# Patient Record
Sex: Female | Born: 1955 | Race: White | Hispanic: No | Smoking: Never smoker
Health system: Southern US, Community
[De-identification: ages and names within clinical notes are randomized; demographics above are authoritative.]

## PROBLEM LIST (undated history)

## (undated) DIAGNOSIS — R3915 Urgency of urination: Secondary | ICD-10-CM

## (undated) DIAGNOSIS — E785 Hyperlipidemia, unspecified: Secondary | ICD-10-CM

## (undated) DIAGNOSIS — U071 COVID-19: Secondary | ICD-10-CM

## (undated) DIAGNOSIS — D649 Anemia, unspecified: Secondary | ICD-10-CM

## (undated) DIAGNOSIS — E119 Type 2 diabetes mellitus without complications: Secondary | ICD-10-CM

## (undated) DIAGNOSIS — R519 Headache, unspecified: Secondary | ICD-10-CM

## (undated) DIAGNOSIS — G4730 Sleep apnea, unspecified: Secondary | ICD-10-CM

## (undated) DIAGNOSIS — M1990 Unspecified osteoarthritis, unspecified site: Secondary | ICD-10-CM

## (undated) DIAGNOSIS — E7800 Pure hypercholesterolemia, unspecified: Secondary | ICD-10-CM

## (undated) DIAGNOSIS — S52501A Unspecified fracture of the lower end of right radius, initial encounter for closed fracture: Secondary | ICD-10-CM

## (undated) DIAGNOSIS — R6510 Systemic inflammatory response syndrome (SIRS) of non-infectious origin without acute organ dysfunction: Secondary | ICD-10-CM

## (undated) DIAGNOSIS — F32A Depression, unspecified: Secondary | ICD-10-CM

## (undated) DIAGNOSIS — R51 Headache: Secondary | ICD-10-CM

## (undated) DIAGNOSIS — Z973 Presence of spectacles and contact lenses: Secondary | ICD-10-CM

## (undated) DIAGNOSIS — Z87442 Personal history of urinary calculi: Secondary | ICD-10-CM

## (undated) DIAGNOSIS — I10 Essential (primary) hypertension: Secondary | ICD-10-CM

## (undated) DIAGNOSIS — F329 Major depressive disorder, single episode, unspecified: Secondary | ICD-10-CM

## (undated) DIAGNOSIS — I499 Cardiac arrhythmia, unspecified: Secondary | ICD-10-CM

## (undated) HISTORY — DX: Type 2 diabetes mellitus without complications: E11.9

## (undated) HISTORY — PX: ABDOMINAL HYSTERECTOMY: SHX81

## (undated) HISTORY — PX: APPENDECTOMY: SHX54

## (undated) HISTORY — PX: TUBAL LIGATION: SHX77

## (undated) HISTORY — PX: OTHER SURGICAL HISTORY: SHX169

## (undated) HISTORY — PX: COLONOSCOPY: SHX174

## (undated) HISTORY — PX: TONSILLECTOMY: SUR1361

## (undated) HISTORY — PX: KNEE ARTHROPLASTY: SHX992

## (undated) HISTORY — PX: ADENOIDECTOMY: SHX5191

## (undated) HISTORY — PX: DILATION AND CURETTAGE OF UTERUS: SHX78

---

## 2016-08-15 ENCOUNTER — Emergency Department (HOSPITAL_COMMUNITY): Payer: BLUE CROSS/BLUE SHIELD

## 2016-08-15 ENCOUNTER — Encounter (HOSPITAL_COMMUNITY): Payer: Self-pay | Admitting: *Deleted

## 2016-08-15 ENCOUNTER — Emergency Department (HOSPITAL_COMMUNITY)
Admission: EM | Admit: 2016-08-15 | Discharge: 2016-08-15 | Disposition: A | Discharge status: Home / Self Care | Payer: BLUE CROSS/BLUE SHIELD | Attending: Emergency Medicine | Admitting: Emergency Medicine

## 2016-08-15 DIAGNOSIS — I10 Essential (primary) hypertension: Secondary | ICD-10-CM | POA: Diagnosis not present

## 2016-08-15 DIAGNOSIS — Y929 Unspecified place or not applicable: Secondary | ICD-10-CM | POA: Insufficient documentation

## 2016-08-15 DIAGNOSIS — Y939 Activity, unspecified: Secondary | ICD-10-CM | POA: Diagnosis not present

## 2016-08-15 DIAGNOSIS — S6991XA Unspecified injury of right wrist, hand and finger(s), initial encounter: Secondary | ICD-10-CM | POA: Diagnosis present

## 2016-08-15 DIAGNOSIS — Z79899 Other long term (current) drug therapy: Secondary | ICD-10-CM | POA: Insufficient documentation

## 2016-08-15 DIAGNOSIS — S52591A Other fractures of lower end of right radius, initial encounter for closed fracture: Secondary | ICD-10-CM | POA: Insufficient documentation

## 2016-08-15 DIAGNOSIS — Y9301 Activity, walking, marching and hiking: Secondary | ICD-10-CM | POA: Diagnosis not present

## 2016-08-15 DIAGNOSIS — Y999 Unspecified external cause status: Secondary | ICD-10-CM | POA: Diagnosis not present

## 2016-08-15 DIAGNOSIS — R52 Pain, unspecified: Secondary | ICD-10-CM

## 2016-08-15 DIAGNOSIS — W1830XA Fall on same level, unspecified, initial encounter: Secondary | ICD-10-CM | POA: Insufficient documentation

## 2016-08-15 DIAGNOSIS — S62101A Fracture of unspecified carpal bone, right wrist, initial encounter for closed fracture: Secondary | ICD-10-CM

## 2016-08-15 HISTORY — DX: Essential (primary) hypertension: I10

## 2016-08-15 MED ORDER — ONDANSETRON HCL 4 MG/2ML IJ SOLN
4.0000 mg | Freq: Once | INTRAMUSCULAR | Status: AC
  Administered 2016-08-15: 4 mg via INTRAVENOUS
  Filled 2016-08-15: qty 2

## 2016-08-15 MED ORDER — HYDROMORPHONE HCL 2 MG/ML IJ SOLN
1.0000 mg | Freq: Once | INTRAMUSCULAR | Status: AC
  Administered 2016-08-15: 1 mg via INTRAVENOUS
  Filled 2016-08-15: qty 1

## 2016-08-15 MED ORDER — KETAMINE HCL 10 MG/ML IJ SOLN: 1.0000 mg/kg | Freq: Once | INTRAMUSCULAR | Status: DC

## 2016-08-15 MED ORDER — FENTANYL CITRATE (PF) 100 MCG/2ML IJ SOLN
INTRAMUSCULAR | Status: AC
  Administered 2016-08-15: 50 ug via NASAL
  Filled 2016-08-15: qty 2

## 2016-08-15 MED ORDER — FENTANYL CITRATE (PF) 100 MCG/2ML IJ SOLN
50.0000 ug | INTRAMUSCULAR | Status: AC | PRN
  Administered 2016-08-15 (×2): 50 ug via NASAL

## 2016-08-15 MED ORDER — SODIUM CHLORIDE 0.9 % IV BOLUS (SEPSIS)
1000.0000 mL | Freq: Once | INTRAVENOUS | Status: AC
  Administered 2016-08-15: 1000 mL via INTRAVENOUS

## 2016-08-15 MED ORDER — KETAMINE HCL-SODIUM CHLORIDE 100-0.9 MG/10ML-% IV SOSY
1.0000 mg/kg | PREFILLED_SYRINGE | Freq: Once | INTRAVENOUS | Status: AC
Start: 2016-08-15 — End: 2016-08-15
  Administered 2016-08-15: 80 mg via INTRAVENOUS
  Filled 2016-08-15: qty 20

## 2016-08-15 MED ORDER — LIDOCAINE HCL (PF) 1 % IJ SOLN
30.0000 mL | Freq: Once | INTRAMUSCULAR | Status: AC
  Administered 2016-08-15: 30 mL
  Filled 2016-08-15: qty 30

## 2016-08-15 MED ORDER — FENTANYL CITRATE (PF) 100 MCG/2ML IJ SOLN
INTRAMUSCULAR | Status: AC
  Filled 2016-08-15: qty 2

## 2016-08-15 NOTE — ED Provider Notes (Signed)
[facility]-EMERGENCY DEPT Provider Note   CSN: [id number] Arrival date & time: 08/15/16  1730     History   Chief Complaint Chief Complaint  Patient presents with  . Arm Pain  . Fall    HPI Patricia Davidson is a 61 y.o. female.  HPI Patient arrives after she had an accidental fall onto her right wrist while walking her dog today She states her only injuries at her right wrist She did not injure her head She states the pain is severe EMS arrived and noted an obvious deformity to her right wrist Patient does take chronic narcotics but states pain is severe She states she has some diffuse numbness along all 5 digits but no weakness No wound  Past Medical History:  Diagnosis Date  . Hypertension     There are no active problems to display for this patient.   Past Surgical History:  Procedure Laterality Date  . ABDOMINAL HYSTERECTOMY    . KNEE ARTHROPLASTY      OB History    No data available       Home Medications    Prior to Admission medications   Medication Sig Start Date End Date Taking? Authorizing Provider  amoxicillin (AMOXIL) 500 MG capsule Take 2,000 mg by mouth See admin instructions. One hour prior to dental appointments   Yes Historical Provider, MD  amphetamine-dextroamphetamine (ADDERALL XR) 30 MG 24 hr capsule Take 30 mg by mouth every morning.   Yes Historical Provider, MD  amphetamine-dextroamphetamine (ADDERALL) 10 MG tablet Take 10 mg by mouth 2 (two) times daily as needed (as directed).    Yes Historical Provider, MD  aspirin EC 81 MG tablet Take 81 mg by mouth daily.   Yes Historical Provider, MD  atenolol (TENORMIN) 50 MG tablet Take 50 mg by mouth daily.   Yes Historical Provider, MD  atorvastatin (LIPITOR) 40 MG tablet Take 40 mg by mouth at bedtime.   Yes Historical Provider, MD  calcium-vitamin D (OSCAL WITH D) 500-200 MG-UNIT tablet Take 1 tablet by mouth at bedtime.   Yes Historical Provider, MD  DULoxetine (CYMBALTA) 30 MG capsule Take  90 mg by mouth at bedtime.   Yes Historical Provider, MD  hydrochlorothiazide (MICROZIDE) 12.5 MG capsule Take 12.5 mg by mouth daily.   Yes Historical Provider, MD  HYDROcodone-acetaminophen (NORCO) 10-325 MG tablet Take 1 tablet by mouth every 6 (six) hours as needed (for breakthrough pain).   Yes Historical Provider, MD  morphine (MS CONTIN) 100 MG 12 hr tablet Take 100 mg by mouth at bedtime.   Yes Historical Provider, MD    Family History No family history on file.  Social History Social History  Substance Use Topics  . Smoking status: Never Smoker  . Smokeless tobacco: Never Used  . Alcohol use No     Allergies   Sulfa antibiotics   Review of Systems Review of Systems  Cardiovascular: Negative for chest pain.  Neurological: Negative for syncope.  All other systems reviewed and are negative.    Physical Exam Updated Vital Signs BP 156/80   Pulse 75   Temp 98.2 F (36.8 C) (Oral)   Resp 15   Wt 115.7 kg   SpO2 100%   Physical Exam  Constitutional: She appears well-developed and well-nourished. No distress.  HENT:  Head: Normocephalic and atraumatic.  Eyes: Conjunctivae are normal.  Neck: Neck supple.  Cardiovascular: Normal rate and regular rhythm.   No murmur heard. Pulmonary/Chest: Effort normal and breath sounds normal. No  respiratory distress.  Abdominal: Soft. There is no tenderness.  Musculoskeletal:  No c/t/l spine tenderness No tenderness along MSK system except in right wrist where she has an obvious deformity with swelling, deformity of wrist Distally has numbness of all five digits but still can feel 2+ ulnar and radial nerve Is able to abduct thumb, cross 2-3 digits, and touch 5th and first digits  Neurological: She is alert.  Skin: Skin is warm and dry. Capillary refill takes less than 2 seconds.  Psychiatric: She has a normal mood and affect.  Nursing note and vitals reviewed.    ED Treatments / Results  Labs (all labs ordered are  listed, but only abnormal results are displayed) Labs Reviewed - No data to display  EKG  EKG Interpretation None       Radiology Dg Elbow 2 Views Right  Result Date: 08/15/2016 CLINICAL DATA:  Status post fall with wrist and elbow pain. EXAM: RIGHT FOREARM - 2 VIEW; RIGHT ELBOW - 2 VIEW COMPARISON:  None. FINDINGS: Right forearm: Impacted distal radius fracture with volar apex angulation and mild comminution. Probable intra-articular extension of the fracture. Mildly displaced ulnar styloid fracture. No radiocarpal dislocation. Mild osteoarthrosis of the basal joint and triscaphe complex. Right elbow: There is no evidence of fracture or other focal bone lesions. Soft tissues are unremarkable. No elbow joint effusion or dislocation. IMPRESSION: 1. Acute impacted distal radius fracture with volar apex angulation and mild comminution. Probable intra-articular extension. 2. Mildly displaced acute ulnar styloid fracture. 3. No wrist dislocation. 4. No acute fracture or dislocation of the right elbow. Electronically Signed   By: Mitzi Hansen M.D.   On: 08/15/2016 20:07   Dg Forearm Right  Result Date: 08/15/2016 CLINICAL DATA:  Status post fall with wrist and elbow pain. EXAM: RIGHT FOREARM - 2 VIEW; RIGHT ELBOW - 2 VIEW COMPARISON:  None. FINDINGS: Right forearm: Impacted distal radius fracture with volar apex angulation and mild comminution. Probable intra-articular extension of the fracture. Mildly displaced ulnar styloid fracture. No radiocarpal dislocation. Mild osteoarthrosis of the basal joint and triscaphe complex. Right elbow: There is no evidence of fracture or other focal bone lesions. Soft tissues are unremarkable. No elbow joint effusion or dislocation. IMPRESSION: 1. Acute impacted distal radius fracture with volar apex angulation and mild comminution. Probable intra-articular extension. 2. Mildly displaced acute ulnar styloid fracture. 3. No wrist dislocation. 4. No acute  fracture or dislocation of the right elbow. Electronically Signed   By: Mitzi Hansen M.D.   On: 08/15/2016 20:07   Dg Wrist 2 Views Right  Result Date: 08/15/2016 CLINICAL DATA:  Status post reduction of right wrist. EXAM: RIGHT WRIST - 2 VIEW COMPARISON:  2/21/ 18 FINDINGS: Interval placement of overlying casting material. Close reduction of the comminuted distal radius fracture. The fracture fragments are in near anatomic alignment. Ulnar styloid fracture is again identified. IMPRESSION: 1. Status post close reduction of comminuted distal radius fracture Electronically Signed   By: Signa Kell M.D.   On: 08/15/2016 21:37   Dg Wrist Complete Right  Result Date: 08/15/2016 CLINICAL DATA:  Right wrist pain.  Fall EXAM: RIGHT WRIST - COMPLETE 3+ VIEW COMPARISON:  None. FINDINGS: There is an acute and comminuted fracture involving the distal radius. There is dorsal angulation of the distal fracture fragments. Ulnar styloid fracture also noted. IMPRESSION: 1. Acute and comminuted distal radius fracture 2. Ulnar styloid fracture. Electronically Signed   By: Signa Kell M.D.   On: 08/15/2016 18:42  Procedures .Sedation Date/Time: 08/15/2016 11:17 PM Performed by: Sidney Ace Authorized by: Sidney Ace   Consent:    Consent obtained:  Verbal and written   Consent given by:  Patient   Risks discussed:  Allergic reaction, dysrhythmia, inadequate sedation, nausea, vomiting, respiratory compromise necessitating ventilatory assistance and intubation, prolonged sedation necessitating reversal and prolonged hypoxia resulting in organ damage   Alternatives discussed:  Analgesia without sedation, regional anesthesia and anxiolysis Indications:    Procedure performed:  Fracture reduction   Procedure necessitating sedation performed by:  Physician performing sedation   Intended level of sedation:  Moderate (conscious sedation) Pre-sedation assessment:    Time since last  food or drink:  6h   NPO status caution: urgency dictates proceeding with non-ideal NPO status     ASA classification: class 1 - normal, healthy patient     Neck mobility: normal     Mouth opening:  3 or more finger widths   Thyromental distance:  2 finger widths   Mallampati score:  II - soft palate, uvula, fauces visible   Pre-sedation assessments completed and reviewed: airway patency, cardiovascular function, hydration status, mental status, nausea/vomiting, pain level, respiratory function and temperature     History of difficult intubation: no   Immediate pre-procedure details:    Reassessment: Patient reassessed immediately prior to procedure     Reviewed: vital signs, relevant labs/tests and NPO status     Verified: bag valve mask available, emergency equipment available, intubation equipment available, IV patency confirmed, oxygen available and reversal medications available   Procedure details (see MAR for exact dosages):    Preoxygenation:  Nasal cannula   Sedation:  Ketamine   Analgesia:  Hydromorphone   Intra-procedure monitoring:  Blood pressure monitoring, cardiac monitor, continuous pulse oximetry, continuous capnometry, frequent LOC assessments and frequent vital sign checks   Intra-procedure events: none   Post-procedure details:    Attendance: Constant attendance by certified staff until patient recovered     Recovery: Patient returned to pre-procedure baseline     Post-sedation assessments completed and reviewed: airway patency, cardiovascular function, hydration status, mental status, nausea/vomiting, pain level, respiratory function and temperature     Specimens recovered:  None   Patient is stable for discharge or admission: yes     Patient tolerance:  Tolerated well, no immediate complications Comments:     Please see nursing notes for times of start of sedation and end of sedation as well as written consent form for time we discussed with patient Reduction of  fracture Date/Time: 08/15/2016 11:20 PM Performed by: Sidney Ace Authorized by: Sidney Ace  Consent: Verbal consent obtained. Risks and benefits: risks, benefits and alternatives were discussed Consent given by: patient Patient understanding: patient states understanding of the procedure being performed Patient consent: the patient's understanding of the procedure matches consent given Procedure consent: procedure consent matches procedure scheduled Relevant documents: relevant documents present and verified Test results: test results available and properly labeled Site marked: the operative site was marked Imaging studies: imaging studies available Patient identity confirmed: verbally with patient Time out: Immediately prior to procedure a "time out" was called to verify the correct patient, procedure, equipment, support staff and site/side marked as required. Local anesthesia used: yes Anesthesia: hematoma block  Anesthesia: Local anesthesia used: yes Local Anesthetic: lidocaine 1% without epinephrine Anesthetic total: 10 mL  Sedation: Patient sedated: no Patient tolerance: Patient tolerated the procedure well with no immediate complications    (including critical care time)  Medications Ordered in ED Medications  fentaNYL (SUBLIMAZE) 100 MCG/2ML injection (not administered)  fentaNYL (SUBLIMAZE) injection 50 mcg (50 mcg Nasal Given 08/15/16 1845)  HYDROmorphone (DILAUDID) injection 1 mg (1 mg Intravenous Given 08/15/16 1931)  lidocaine (PF) (XYLOCAINE) 1 % injection 30 mL (30 mLs Other Given 08/15/16 2030)  sodium chloride 0.9 % bolus 1,000 mL (0 mLs Intravenous Stopped 08/15/16 2205)  ondansetron (ZOFRAN) injection 4 mg (4 mg Intravenous Given 08/15/16 2030)  ketamine 100 mg in normal saline 10 mL (10mg /mL) syringe (80 mg Intravenous Given 08/15/16 2054)     Initial Impression / Assessment and Plan / ED Course  I have reviewed the triage vital signs and  the nursing notes.  Pertinent labs & imaging results that were available during my care of the patient were reviewed by me and considered in my medical decision making (see chart for details).     No other injury suspected based on abcense of pain elsewhere, no head trauma, and unremarkable exam. However, right UE has R radial and ulnar styloid fx that is very displaced. Attempted reduction with acceptable alignment, although still not perfectly aligned. Spoke with Hand surgery on call, who reviewed xrays and states they feel comfortable seeing in clinic on Friday. Pt already on many narcotic medications she receives from pain clinic, no additional given as she has prn meds to take over her qhs morphine. Strict return precautions given including severe pain, discoloration of digits, numbness (pt reassessed after reduction with excellent pulses and resolution of diffuse tingling), or significant swelling. Splint placed and advised to elevate. No weight bearing, no driving. Fu with ortho.   Final Clinical Impressions(s) / ED Diagnoses   Final diagnoses:  Closed fracture of right wrist, initial encounter    New Prescriptions Discharge Medication List as of 08/15/2016  9:48 PM       Sidney Ace, MD 08/15/16 2325    Derwood Kaplan, MD 08/16/16 1544

## 2016-08-15 NOTE — ED Notes (Signed)
Pt transported to xray 

## 2016-08-15 NOTE — ED Notes (Signed)
EDP at bedside  

## 2016-08-15 NOTE — Progress Notes (Signed)
Orthopedic Tech Progress Note Patient Details:  Britt Boozerlizabeth Santibanez 04/08/1956 [id number]  Ortho Devices Type of Ortho Device: Ace wrap, Arm sling, Sugartong splint Ortho Device/Splint Location: RUE Ortho Device/Splint Interventions: Ordered, Application   Jennye MoccasinHughes, Mackenze Grandison Craig 08/15/2016, 9:36 PM

## 2016-08-15 NOTE — ED Notes (Signed)
Port xray at beside

## 2016-08-15 NOTE — ED Notes (Signed)
Charge RN aware of need for room, pts arm immobolized with blanket & roll guaze, pts arm stable, pt tolerated well

## 2016-08-15 NOTE — ED Triage Notes (Signed)
Pt has obvious deformity to R wrist post falling backward today while walking her dog, pt has + pulse, skin warm to touch, pt able to wiggle fingers, all skin intact, denies hitting head, denies LOC, A&O x4

## 2016-08-15 NOTE — Discharge Instructions (Signed)
No driving, no heavy machinery while on narcotics

## 2016-08-15 NOTE — Sedation Documentation (Addendum)
Per Rhunette CroftNanavati MD start with 80mg  Ketamine

## 2016-08-17 ENCOUNTER — Encounter (HOSPITAL_COMMUNITY): Payer: Self-pay | Admitting: *Deleted

## 2016-08-17 NOTE — Progress Notes (Signed)
Pt denies SOB, chest pain, and being under the care of a cardiologist. Pt denies having an echo but stated that both a stress test and cardiac cath was performed > 10 years ago. Pt denies having an EKG and chest x ray within the last year. Pt denies having recent labs. Pt made aware to stop taking Aspirin, vitamins, fish oil and herbal medications. Do not take any NSAIDs ie: Ibuprofen, Advil, Naproxen, BC and Goody Powder or any medication containing Aspirin. Pt verbalized understanding of all pre-op instructions.

## 2016-08-17 NOTE — H&P (Signed)
Patricia Davidson is an 61 y.o. female.   Chief Complaint: RIGHT DISTAL RADIUS INTRAARTICULAR FRACTURE  HPI: THE PATIENT INJURED HER RIGHT WRIST ON 08/15/16 WHEN SHE FELL WHILE WALKING HER DOG.  THE PATIENT WAS SEEN IN THE EMERGENCY DEPARTMENT WHERE THE FRACTURE WAS REDUCED AND PUT IN A SPLINT. THE PATIENT WAS EVALUATED IN THE OFFICE TODAY.  DISCUSSED THE RATIONALE FOR SURGERY AND THE NEED TO REALIGN TO BONE TO ALLOW THE BONE TO HEAL.  DISCUSSED SURGICAL PROCEDURE, INCLUDING RISKS VERSUS BENEFITS, AND THE POST-OPERATIVE RECOVERY.  THE PATIENT IS HERE TODAY FOR SURGERY.  Past Medical History:  Diagnosis Date  . Hypertension     Past Surgical History:  Procedure Laterality Date  . ABDOMINAL HYSTERECTOMY    . KNEE ARTHROPLASTY      No family history on file. Social History:  reports that she has never smoked. She has never used smokeless tobacco. She reports that she does not drink alcohol or use drugs.  Allergies:  Allergies  Allergen Reactions  . Sulfa Antibiotics Other (See Comments)    Childhood allergy (unsure of reaction)    No prescriptions prior to admission.    No results found for this or any previous visit (from the past 48 hour(s)). Dg Elbow 2 Views Right  Result Date: 08/15/2016 CLINICAL DATA:  Status post fall with wrist and elbow pain. EXAM: RIGHT FOREARM - 2 VIEW; RIGHT ELBOW - 2 VIEW COMPARISON:  None. FINDINGS: Right forearm: Impacted distal radius fracture with volar apex angulation and mild comminution. Probable intra-articular extension of the fracture. Mildly displaced ulnar styloid fracture. No radiocarpal dislocation. Mild osteoarthrosis of the basal joint and triscaphe complex. Right elbow: There is no evidence of fracture or other focal bone lesions. Soft tissues are unremarkable. No elbow joint effusion or dislocation. IMPRESSION: 1. Acute impacted distal radius fracture with volar apex angulation and mild comminution. Probable intra-articular extension.  2. Mildly displaced acute ulnar styloid fracture. 3. No wrist dislocation. 4. No acute fracture or dislocation of the right elbow. Electronically Signed   By: Mitzi HansenLance  Furusawa-Stratton M.D.   On: 08/15/2016 20:07   Dg Forearm Right  Result Date: 08/15/2016 CLINICAL DATA:  Status post fall with wrist and elbow pain. EXAM: RIGHT FOREARM - 2 VIEW; RIGHT ELBOW - 2 VIEW COMPARISON:  None. FINDINGS: Right forearm: Impacted distal radius fracture with volar apex angulation and mild comminution. Probable intra-articular extension of the fracture. Mildly displaced ulnar styloid fracture. No radiocarpal dislocation. Mild osteoarthrosis of the basal joint and triscaphe complex. Right elbow: There is no evidence of fracture or other focal bone lesions. Soft tissues are unremarkable. No elbow joint effusion or dislocation. IMPRESSION: 1. Acute impacted distal radius fracture with volar apex angulation and mild comminution. Probable intra-articular extension. 2. Mildly displaced acute ulnar styloid fracture. 3. No wrist dislocation. 4. No acute fracture or dislocation of the right elbow. Electronically Signed   By: Mitzi HansenLance  Furusawa-Stratton M.D.   On: 08/15/2016 20:07   Dg Wrist 2 Views Right  Result Date: 08/15/2016 CLINICAL DATA:  Status post reduction of right wrist. EXAM: RIGHT WRIST - 2 VIEW COMPARISON:  2/21/ 18 FINDINGS: Interval placement of overlying casting material. Close reduction of the comminuted distal radius fracture. The fracture fragments are in near anatomic alignment. Ulnar styloid fracture is again identified. IMPRESSION: 1. Status post close reduction of comminuted distal radius fracture Electronically Signed   By: Signa Kellaylor  Stroud M.D.   On: 08/15/2016 21:37   Dg Wrist Complete Right  Result Date: 08/15/2016  CLINICAL DATA:  Right wrist pain.  Fall EXAM: RIGHT WRIST - COMPLETE 3+ VIEW COMPARISON:  None. FINDINGS: There is an acute and comminuted fracture involving the distal radius. There is dorsal  angulation of the distal fracture fragments. Ulnar styloid fracture also noted. IMPRESSION: 1. Acute and comminuted distal radius fracture 2. Ulnar styloid fracture. Electronically Signed   By: Signa Kell M.D.   On: 08/15/2016 18:42    ROS NO RECENT ILLNESSES OR HOSPITALIZATIONS  There were no vitals taken for this visit. Physical Exam  Physical Exam  General Appearance:  Alert, cooperative, no distress, appears stated age  Head:  Normocephalic, without obvious abnormality, atraumatic  Eyes:  Pupils equal, conjunctiva/corneas clear,         Throat: Lips, mucosa, and tongue normal; teeth and gums normal  Neck: No visible masses     Lungs:   respirations unlabored  Chest Wall:  No tenderness or deformity  Heart:  Regular rate and rhythm,  Abdomen:   Soft, non-tender,         Extremities: RUE: SPLINT INTACT, FINGERS WARM WELL PERFUSED WIGGLES FINGERS  Pulses: 2+ and symmetric  Skin: Skin color, texture, turgor normal, no rashes or lesions     Neurologic: Normal    Assessment RIGHT DISTAL RADIUS INTRAARTICULAR FRACTURE,DISPLACED  Plan RIGHT DISTAL RADIUS OPEN REDUCTION AND INTERNAL FIXATION WITH REPAIR AS INDICATED  R/B/A DISCUSSED WITH PT IN OFFICE.  PT VOICED UNDERSTANDING OF PLAN CONSENT SIGNED DAY OF SURGERY PT SEEN AND EXAMINED PRIOR TO OPERATIVE PROCEDURE/DAY OF SURGERY SITE MARKED. QUESTIONS ANSWERED WILL GO HOME FOLLOWING SURGERY  WE ARE PLANNING SURGERY FOR YOUR UPPER EXTREMITY. THE RISKS AND BENEFITS OF SURGERY INCLUDE BUT NOT LIMITED TO BLEEDING INFECTION, DAMAGE TO NEARBY NERVES ARTERIES TENDONS, FAILURE OF SURGERY TO ACCOMPLISH ITS INTENDED GOALS, PERSISTENT SYMPTOMS AND NEED FOR FURTHER SURGICAL INTERVENTION. WITH THIS IN MIND WE WILL PROCEED. I HAVE DISCUSSED WITH THE PATIENT THE PRE AND POSTOPERATIVE REGIMEN AND THE DOS AND DON'TS. PT VOICED UNDERSTANDING AND INFORMED CONSENT SIGNED.  Karma Greaser 08/17/2016, 3:21 PM

## 2016-08-18 ENCOUNTER — Encounter (HOSPITAL_COMMUNITY): Payer: Self-pay | Admitting: *Deleted

## 2016-08-18 ENCOUNTER — Ambulatory Visit (HOSPITAL_COMMUNITY): Payer: BLUE CROSS/BLUE SHIELD | Admitting: Anesthesiology

## 2016-08-18 ENCOUNTER — Ambulatory Visit (HOSPITAL_COMMUNITY)
Admission: RE | Admit: 2016-08-18 | Discharge: 2016-08-18 | Disposition: A | Discharge status: Home / Self Care | Payer: BLUE CROSS/BLUE SHIELD | Source: Ambulatory Visit | Attending: Orthopedic Surgery | Admitting: Orthopedic Surgery

## 2016-08-18 ENCOUNTER — Encounter (HOSPITAL_COMMUNITY)
Admission: RE | Disposition: A | Discharge status: Home / Self Care | Payer: Self-pay | Source: Ambulatory Visit | Attending: Orthopedic Surgery

## 2016-08-18 DIAGNOSIS — I10 Essential (primary) hypertension: Secondary | ICD-10-CM | POA: Insufficient documentation

## 2016-08-18 DIAGNOSIS — Z79899 Other long term (current) drug therapy: Secondary | ICD-10-CM | POA: Diagnosis not present

## 2016-08-18 DIAGNOSIS — S52611A Displaced fracture of right ulna styloid process, initial encounter for closed fracture: Secondary | ICD-10-CM | POA: Insufficient documentation

## 2016-08-18 DIAGNOSIS — Z79891 Long term (current) use of opiate analgesic: Secondary | ICD-10-CM | POA: Insufficient documentation

## 2016-08-18 DIAGNOSIS — Z7982 Long term (current) use of aspirin: Secondary | ICD-10-CM | POA: Diagnosis not present

## 2016-08-18 DIAGNOSIS — W1839XA Other fall on same level, initial encounter: Secondary | ICD-10-CM | POA: Diagnosis not present

## 2016-08-18 DIAGNOSIS — S52501A Unspecified fracture of the lower end of right radius, initial encounter for closed fracture: Secondary | ICD-10-CM

## 2016-08-18 DIAGNOSIS — S52571A Other intraarticular fracture of lower end of right radius, initial encounter for closed fracture: Secondary | ICD-10-CM | POA: Diagnosis present

## 2016-08-18 DIAGNOSIS — F329 Major depressive disorder, single episode, unspecified: Secondary | ICD-10-CM | POA: Insufficient documentation

## 2016-08-18 HISTORY — DX: Unspecified fracture of the lower end of right radius, initial encounter for closed fracture: S52.501A

## 2016-08-18 HISTORY — DX: Major depressive disorder, single episode, unspecified: F32.9

## 2016-08-18 HISTORY — DX: Anemia, unspecified: D64.9

## 2016-08-18 HISTORY — DX: Depression, unspecified: F32.A

## 2016-08-18 HISTORY — DX: Pure hypercholesterolemia, unspecified: E78.00

## 2016-08-18 HISTORY — DX: Headache, unspecified: R51.9

## 2016-08-18 HISTORY — DX: Headache: R51

## 2016-08-18 HISTORY — DX: Unspecified osteoarthritis, unspecified site: M19.90

## 2016-08-18 HISTORY — PX: OPEN REDUCTION INTERNAL FIXATION (ORIF) DISTAL RADIAL FRACTURE: SHX5989

## 2016-08-18 SURGERY — OPEN REDUCTION INTERNAL FIXATION (ORIF) DISTAL RADIUS FRACTURE
Anesthesia: Monitor Anesthesia Care | Site: Wrist | Laterality: Right

## 2016-08-18 MED ORDER — METHOCARBAMOL 500 MG PO TABS: 500.0000 mg | ORAL_TABLET | Freq: Three times a day (TID) | ORAL | 0 refills | Status: DC

## 2016-08-18 MED ORDER — LACTATED RINGERS IV SOLN
INTRAVENOUS | Status: DC
  Administered 2016-08-18: 14:00:00 via INTRAVENOUS

## 2016-08-18 MED ORDER — CHLORHEXIDINE GLUCONATE 4 % EX LIQD: 60.0000 mL | Freq: Once | CUTANEOUS | Status: DC

## 2016-08-18 MED ORDER — MIDAZOLAM HCL 2 MG/2ML IJ SOLN
INTRAMUSCULAR | Status: AC
  Filled 2016-08-18: qty 2

## 2016-08-18 MED ORDER — FENTANYL CITRATE (PF) 100 MCG/2ML IJ SOLN: 25.0000 ug | INTRAMUSCULAR | Status: DC | PRN

## 2016-08-18 MED ORDER — OXYCODONE HCL 5 MG PO TABS: 5.0000 mg | ORAL_TABLET | Freq: Once | ORAL | Status: DC | PRN

## 2016-08-18 MED ORDER — OXYCODONE-ACETAMINOPHEN 5-325 MG PO TABS: 1.0000 | ORAL_TABLET | Freq: Three times a day (TID) | ORAL | 0 refills | Status: AC

## 2016-08-18 MED ORDER — FENTANYL CITRATE (PF) 100 MCG/2ML IJ SOLN
INTRAMUSCULAR | Status: AC
  Filled 2016-08-18: qty 2

## 2016-08-18 MED ORDER — CEFAZOLIN SODIUM-DEXTROSE 2-4 GM/100ML-% IV SOLN
2.0000 g | INTRAVENOUS | Status: AC
  Administered 2016-08-18: 2 g via INTRAVENOUS
  Filled 2016-08-18: qty 100

## 2016-08-18 MED ORDER — PROPOFOL 500 MG/50ML IV EMUL
INTRAVENOUS | Status: DC | PRN
  Administered 2016-08-18: 100 ug/kg/min via INTRAVENOUS

## 2016-08-18 MED ORDER — MIDAZOLAM HCL 2 MG/2ML IJ SOLN
INTRAMUSCULAR | Status: DC | PRN
  Administered 2016-08-18: 0.5 mg via INTRAVENOUS
  Administered 2016-08-18: 2 mg via INTRAVENOUS
  Administered 2016-08-18: 0.5 mg via INTRAVENOUS

## 2016-08-18 MED ORDER — SUFENTANIL CITRATE 50 MCG/ML IV SOLN
INTRAVENOUS | Status: DC | PRN
  Administered 2016-08-18: 5 ug via INTRAVENOUS

## 2016-08-18 MED ORDER — 0.9 % SODIUM CHLORIDE (POUR BTL) OPTIME
TOPICAL | Status: DC | PRN
  Administered 2016-08-18: 1000 mL

## 2016-08-18 MED ORDER — FENTANYL CITRATE (PF) 100 MCG/2ML IJ SOLN
INTRAMUSCULAR | Status: DC | PRN
  Administered 2016-08-18 (×2): 50 ug via INTRAVENOUS

## 2016-08-18 MED ORDER — VITAMIN C 500 MG PO TABS: 500.0000 mg | ORAL_TABLET | Freq: Every day | ORAL | 0 refills | Status: DC

## 2016-08-18 MED ORDER — DOCUSATE SODIUM 100 MG PO CAPS: 100.0000 mg | ORAL_CAPSULE | Freq: Two times a day (BID) | ORAL | 0 refills | Status: DC

## 2016-08-18 MED ORDER — ONDANSETRON HCL 4 MG/2ML IJ SOLN
INTRAMUSCULAR | Status: DC | PRN
  Administered 2016-08-18: 4 mg via INTRAVENOUS

## 2016-08-18 MED ORDER — LACTATED RINGERS IV SOLN
INTRAVENOUS | Status: DC | PRN
  Administered 2016-08-18 (×2): via INTRAVENOUS

## 2016-08-18 MED ORDER — OXYCODONE HCL 5 MG/5ML PO SOLN: 5.0000 mg | Freq: Once | ORAL | Status: DC | PRN

## 2016-08-18 MED ORDER — SUFENTANIL CITRATE 50 MCG/ML IV SOLN
INTRAVENOUS | Status: AC
Start: 2016-08-18 — End: 2016-08-18
  Filled 2016-08-18: qty 1

## 2016-08-18 MED ORDER — SODIUM CHLORIDE 0.9 % IJ SOLN
INTRAMUSCULAR | Status: AC
  Filled 2016-08-18: qty 10

## 2016-08-18 MED ORDER — BUPIVACAINE-EPINEPHRINE (PF) 0.5% -1:200000 IJ SOLN
INTRAMUSCULAR | Status: DC | PRN
  Administered 2016-08-18: 30 mL via PERINEURAL

## 2016-08-18 SURGICAL SUPPLY — 58 items
BANDAGE ACE 3X5.8 VEL STRL LF (GAUZE/BANDAGES/DRESSINGS) ×3 IMPLANT
BANDAGE ACE 4X5 VEL STRL LF (GAUZE/BANDAGES/DRESSINGS) ×3 IMPLANT
BANDAGE ELASTIC 3 VELCRO ST LF (GAUZE/BANDAGES/DRESSINGS) IMPLANT
BIT DRILL 2.2 SS TIBIAL (BIT) ×3 IMPLANT
BLADE CLIPPER SURG (BLADE) IMPLANT
BNDG ESMARK 4X9 LF (GAUZE/BANDAGES/DRESSINGS) ×3 IMPLANT
BNDG GAUZE ELAST 4 BULKY (GAUZE/BANDAGES/DRESSINGS) ×3 IMPLANT
CANISTER SUCT 3000ML PPV (MISCELLANEOUS) ×3 IMPLANT
CORDS BIPOLAR (ELECTRODE) ×3 IMPLANT
COVER SURGICAL LIGHT HANDLE (MISCELLANEOUS) ×3 IMPLANT
CUFF TOURNIQUET SINGLE 18IN (TOURNIQUET CUFF) ×3 IMPLANT
CUFF TOURNIQUET SINGLE 24IN (TOURNIQUET CUFF) IMPLANT
DECANTER SPIKE VIAL GLASS SM (MISCELLANEOUS) ×3 IMPLANT
DRAPE OEC MINIVIEW 54X84 (DRAPES) ×3 IMPLANT
DRAPE SURG 17X11 SM STRL (DRAPES) ×3 IMPLANT
DRSG ADAPTIC 3X8 NADH LF (GAUZE/BANDAGES/DRESSINGS) ×3 IMPLANT
GAUZE SPONGE 4X4 12PLY STRL (GAUZE/BANDAGES/DRESSINGS) ×3 IMPLANT
GAUZE SPONGE 4X4 16PLY XRAY LF (GAUZE/BANDAGES/DRESSINGS) IMPLANT
GLOVE BIOGEL PI IND STRL 8.5 (GLOVE) ×1 IMPLANT
GLOVE BIOGEL PI INDICATOR 8.5 (GLOVE) ×2
GLOVE SURG ORTHO 8.0 STRL STRW (GLOVE) ×3 IMPLANT
GOWN STRL REUS W/ TWL LRG LVL3 (GOWN DISPOSABLE) ×1 IMPLANT
GOWN STRL REUS W/ TWL XL LVL3 (GOWN DISPOSABLE) ×1 IMPLANT
GOWN STRL REUS W/TWL LRG LVL3 (GOWN DISPOSABLE) ×2
GOWN STRL REUS W/TWL XL LVL3 (GOWN DISPOSABLE) ×2
K-WIRE 1.6 (WIRE) ×2
K-WIRE FX5X1.6XNS BN SS (WIRE) ×1
KIT BASIN OR (CUSTOM PROCEDURE TRAY) ×3 IMPLANT
KIT ROOM TURNOVER OR (KITS) ×3 IMPLANT
KWIRE FX5X1.6XNS BN SS (WIRE) ×1 IMPLANT
NEEDLE HYPO 25X1 1.5 SAFETY (NEEDLE) IMPLANT
NS IRRIG 1000ML POUR BTL (IV SOLUTION) ×3 IMPLANT
PACK ORTHO EXTREMITY (CUSTOM PROCEDURE TRAY) ×3 IMPLANT
PAD ARMBOARD 7.5X6 YLW CONV (MISCELLANEOUS) ×6 IMPLANT
PAD CAST 4YDX4 CTTN HI CHSV (CAST SUPPLIES) ×1 IMPLANT
PADDING CAST COTTON 4X4 STRL (CAST SUPPLIES) ×2
PEG LOCKING SMOOTH 2.2X18 (Peg) ×3 IMPLANT
PEG LOCKING SMOOTH 2.2X20 (Screw) ×9 IMPLANT
PEG LOCKING SMOOTH 2.2X22 (Screw) ×9 IMPLANT
PLATE DVR CROSSLOCK STD RT (Plate) ×3 IMPLANT
SCREW LOCK 14X2.7X 3 LD TPR (Screw) ×2 IMPLANT
SCREW LOCK 16X2.7X 3 LD TPR (Screw) ×2 IMPLANT
SCREW LOCKING 2.7X14 (Screw) ×4 IMPLANT
SCREW LOCKING 2.7X15MM (Screw) ×3 IMPLANT
SCREW LOCKING 2.7X16 (Screw) ×4 IMPLANT
SOAP 2 % CHG 4 OZ (WOUND CARE) ×3 IMPLANT
SPLINT FIBERGLASS 3X35 (CAST SUPPLIES) ×3 IMPLANT
SPONGE LAP 4X18 X RAY DECT (DISPOSABLE) IMPLANT
SUT PROLENE 4 0 PS 2 18 (SUTURE) ×3 IMPLANT
SUT VIC AB 2-0 FS1 27 (SUTURE) ×3 IMPLANT
SUT VICRYL 4-0 PS2 18IN ABS (SUTURE) ×3 IMPLANT
SYR CONTROL 10ML LL (SYRINGE) IMPLANT
TOWEL OR 17X24 6PK STRL BLUE (TOWEL DISPOSABLE) ×3 IMPLANT
TOWEL OR 17X26 10 PK STRL BLUE (TOWEL DISPOSABLE) ×3 IMPLANT
TUBE CONNECTING 12'X1/4 (SUCTIONS) ×1
TUBE CONNECTING 12X1/4 (SUCTIONS) ×2 IMPLANT
WATER STERILE IRR 1000ML POUR (IV SOLUTION) ×3 IMPLANT
YANKAUER SUCT BULB TIP NO VENT (SUCTIONS) IMPLANT

## 2016-08-18 NOTE — Progress Notes (Signed)
Per Dr. Maple HudsonMoser EKG and lab work not needed.

## 2016-08-18 NOTE — Discharge Instructions (Signed)
KEEP BANDAGE CLEAN AND DRY °CALL OFFICE FOR F/U APPT [phone] in 2 weeks °Dr. Henna Derderian cell [phone] °KEEP HAND ELEVATED ABOVE HEART °OK TO APPLY ICE TO OPERATIVE AREA °CONTACT OFFICE IF ANY WORSENING PAIN OR CONCERNS. °

## 2016-08-18 NOTE — Anesthesia Procedure Notes (Signed)
Procedure Name: MAC Date/Time: 08/18/2016 2:50 PM Performed by: Mosie Epstein Pre-anesthesia Checklist: Patient identified, Emergency Drugs available, Suction available, Patient being monitored and Timeout performed Patient Re-evaluated:Patient Re-evaluated prior to inductionOxygen Delivery Method: Simple face mask Placement Confirmation: positive ETCO2 Dental Injury: Teeth and Oropharynx as per pre-operative assessment

## 2016-08-18 NOTE — Anesthesia Postprocedure Evaluation (Addendum)
Anesthesia Post Note  Patient: Patricia Davidson  Procedure(s) Performed: Procedure(s) (LRB): RIGHT DISTAL RADIUS OPEN REDUCTION INTERNAL FIXATION AND REPAIR AS INDICATED (Right)  Patient location during evaluation: PACU Anesthesia Type: Regional Level of consciousness: awake and alert Pain management: pain level controlled Vital Signs Assessment: post-procedure vital signs reviewed and stable Respiratory status: spontaneous breathing, nonlabored ventilation, respiratory function stable and patient connected to nasal cannula oxygen Cardiovascular status: stable and blood pressure returned to baseline Postop Assessment: no signs of nausea or vomiting Anesthetic complications: no       Last Vitals:  Vitals:   08/18/16 1606 08/18/16 1636  BP: 112/73 116/69  Pulse: 69 68  Resp: 13 (!) 9  Temp:  36.5 C    Last Pain:  Vitals:   08/18/16 1316  TempSrc: Oral  PainSc: 4                  Antoinette Haskett

## 2016-08-18 NOTE — Anesthesia Preprocedure Evaluation (Signed)
Anesthesia Evaluation  Patient identified by MRN, date of birth, ID band Patient awake    Reviewed: Allergy & Precautions, NPO status , Patient's Chart, lab work & pertinent test results  History of Anesthesia Complications Negative for: history of anesthetic complications  Airway Mallampati: II  TM Distance: >3 FB Neck ROM: Full    Dental  (+) Teeth Intact   Pulmonary neg pulmonary ROS,    breath sounds clear to auscultation       Cardiovascular hypertension, Pt. on medications  Rhythm:Regular     Neuro/Psych  Headaches, PSYCHIATRIC DISORDERS Depression    GI/Hepatic negative GI ROS, Neg liver ROS,   Endo/Other  negative endocrine ROS  Renal/GU negative Renal ROS     Musculoskeletal  (+) Arthritis ,   Abdominal   Peds  Hematology negative hematology ROS (+)   Anesthesia Other Findings   Reproductive/Obstetrics                             Anesthesia Physical Anesthesia Plan  ASA: II  Anesthesia Plan: MAC   Post-op Pain Management:    Induction: Intravenous  Airway Management Planned: Nasal Cannula, Natural Airway and Simple Face Mask  Additional Equipment: None  Intra-op Plan:   Post-operative Plan:   Informed Consent: I have reviewed the patients History and Physical, chart, labs and discussed the procedure including the risks, benefits and alternatives for the proposed anesthesia with the patient or authorized representative who has indicated his/her understanding and acceptance.   Dental advisory given  Plan Discussed with: CRNA and Surgeon  Anesthesia Plan Comments:         Anesthesia Quick Evaluation

## 2016-08-18 NOTE — Transfer of Care (Signed)
Immediate Anesthesia Transfer of Care Note  Patient: Patricia Davidson  Procedure(s) Performed: Procedure(s): RIGHT DISTAL RADIUS OPEN REDUCTION INTERNAL FIXATION AND REPAIR AS INDICATED (Right)  Patient Location: PACU  Anesthesia Type:MAC and MAC combined with regional for post-op pain  Level of Consciousness: awake, alert  and oriented  Airway & Oxygen Therapy: Patient Spontanous Breathing  Post-op Assessment: Report given to RN and Post -op Vital signs reviewed and stable  Post vital signs: Reviewed and stable  Last Vitals:  Vitals:   08/18/16 1316  BP: 132/62  Pulse: 72  Resp: 16  Temp: 37 C    Last Pain:  Vitals:   08/18/16 1316  TempSrc: Oral  PainSc: 4       Patients Stated Pain Goal: 6 (77/41/28 7867)  Complications: No apparent anesthesia complications

## 2016-08-18 NOTE — Op Note (Signed)
[name], [name]           ACCOUNT NO.:  [id number]  MEDICAL RECORD NO.:  [id number]  LOCATION:                                 FACILITY:  PHYSICIAN:  Sharma Covert Davidson, M.D.DATE OF BIRTH:  10-01-1955  DATE OF PROCEDURE:  08/18/2016 DATE OF DISCHARGE:  09/15/2016                              OPERATIVE REPORT   PREOPERATIVE DIAGNOSIS:  Right wrist intra-articular distal radius fracture, two or more fragments.  POSTOPERATIVE DIAGNOSIS:  Right wrist intra-articular distal radius fracture, two or more fragments.  ATTENDING PHYSICIAN:  Sharma Covert, M.D., who scrubbed and present for the entire procedure.  ASSISTANT SURGEON:  None.  ANESTHESIA:  Supraclavicular block with Davidson sedation.  SURGICAL PROCEDURE: 1. Open treatment of right wrist intra-articular distal radius     fracture, two or more fragments. 2. Right wrist brachioradialis tendon release and tendon lengthening. 3. Radiographs, 3 views, right wrist.  SURGICAL IMPLANTS:  DVR cross-lock standard plate [employer].  RADIOGRAPHIC INTERPRETATION:  AP, lateral, and oblique views of the wrist did show the volar plate fixation placed in good position with good restoration of radial height, length, and tilt.  SURGICAL INDICATIONS:  Patricia Davidson is a 61 year old female, who sustained a closed right distal radius fracture.  The patient was seen and evaluated in the office and recommended to undergo the above procedure.  Risks, benefits, and alternatives were discussed in detail with the patient, and signed informed consent was obtained.  Risks include, but not limited to bleeding, infection, damage to nearby nerves, arteries or tendons, loss of motion of the wrist and digits, incomplete relief of symptoms, and need for further surgical intervention.  DESCRIPTION OF PROCEDURE:  The patient was properly identified in the preoperative holding area, marked with a permanent marker made on the right wrist to indicate the  correct operative site.  The patient was then brought back to the operating room and placed supine on the anesthesia room table where Davidson sedation was administered.  The patient's block has been performed.  A well-padded tourniquet was placed in the right brachium, sealed with 1000 drape.  The right upper extremity was then prepped and draped in normal sterile fashion.  Time-out was called. The correct site was identified and procedure then begun.  Attention was then turned to the right wrist.  Longitudinal incision made directly over the FCR sheath.  Limb was elevated and tourniquet insufflated. Dissection carried down through the skin and subcutaneous tissue.  Deep dissection carried down through the FCR sheath.  The FPL was then swept out of the way.  The pronator quadratus was then elevated in an L-shaped fashion.  The pronator elevated and the fracture site were then exposed. This was an intra-articular fracture of two or more fragments.  Once this was carried out, brachioradialis was then carefully released and tendon tenotomy and lengthening of the tendon was then carried out for radial styloid protecting the first dorsal compartment tendons.  The wound was irrigated, the fracture was reduced, and the volar plate was then applied.  It is felt distally with a K-wires and secured proximally with a K-wire.  Plate position was then confirmed using mini C-arm. Once the adequate plate position was  then confirmed and the reduction well aligned, distal fixation was then carried out with the locking pegs from an ulnar to radial direction.  The oblong screw hole was then placed proximally and locking and nonlocking screws were then placed in the shaft to complete the construct.  The wound was irrigated.  The final radiographs were then obtained.  Stress radiography was carried out.  The pronator quadratus was closed with 2-0 Vicryl, subcutaneous tissues closed with 4-0 Vicryl, and skin closed  with simple 4-0 Prolene sutures.  Adaptic dressing and sterile compressive bandage were then applied.  The patient was then placed in a well-padded sugar-tong splint, extubated, and taken to recovery room in good condition.  POSTPROCEDURAL PLAN:  The patient discharged home, seen back in the office in approximately 2 weeks for wound check, suture removal, x-rays, and application of short-arm cast.  Put in a therapy order for the 4- week mark.  See her back in 4-week mark with x-rays out of the cast and then begin an outpatient therapy regimen.     Madelynn DoneFred W. Srija Southard Davidson, M.D.   ______________________________ Madelynn DoneFred W. Patricia Davidson, M.D.    FWO/MEDQ  D:  08/18/2016  T:  08/18/2016  Job:  [id number]

## 2016-08-18 NOTE — Brief Op Note (Signed)
Job id : [id number] orif right distal radius fracture Home today F/u in office in 2 weeks

## 2016-08-18 NOTE — Op Note (Deleted)
  The note originally documented on this encounter has been moved the the encounter in which it belongs.  

## 2016-08-18 NOTE — Anesthesia Procedure Notes (Signed)
Anesthesia Regional Block: Axillary brachial plexus block   Pre-Anesthetic Checklist: ,, timeout performed, Correct Patient, Correct Site, Correct Laterality, Correct Procedure, Correct Position, risks and benefits discussed, surgical consent, pre-op evaluation,  At surgeon's request and post-op pain management  Laterality: Right  Prep: chloraprep       Needles:  Injection technique: Single-shot  Needle Type: Echogenic Needle          Additional Needles:   Procedures: ultrasound guided,,,,,,,,  Narrative:  Start time: 08/18/2016 2:22 PM End time: 08/18/2016 2:28 PM Injection made incrementally with aspirations every 5 mL.  Performed by: Personally   Additional Notes: H+P and labs reviewed, risks and benefits discussed with patient, procedure tolerated well without complications

## 2016-08-20 ENCOUNTER — Encounter (HOSPITAL_COMMUNITY): Payer: Self-pay | Admitting: Orthopedic Surgery

## 2016-11-26 ENCOUNTER — Encounter (HOSPITAL_COMMUNITY): Payer: Self-pay | Admitting: Orthopedic Surgery

## 2016-11-26 NOTE — Addendum Note (Signed)
Addendum  created 11/26/16 1143 by Val EagleMoser, Melony Tenpas, MD   Sign clinical note

## 2017-03-04 ENCOUNTER — Encounter: Payer: Self-pay | Admitting: Vascular Surgery

## 2017-03-05 ENCOUNTER — Encounter: Payer: Self-pay | Admitting: Vascular Surgery

## 2017-03-05 ENCOUNTER — Ambulatory Visit (INDEPENDENT_AMBULATORY_CARE_PROVIDER_SITE_OTHER): Payer: BLUE CROSS/BLUE SHIELD | Admitting: Vascular Surgery

## 2017-03-05 VITALS — BP 136/82 | HR 105 | Temp 98.6°F | Resp 14 | Ht 72.0 in

## 2017-03-05 DIAGNOSIS — I83893 Varicose veins of bilateral lower extremities with other complications: Secondary | ICD-10-CM

## 2017-03-05 DIAGNOSIS — I83891 Varicose veins of right lower extremities with other complications: Secondary | ICD-10-CM | POA: Insufficient documentation

## 2017-03-05 NOTE — Progress Notes (Signed)
Subjective:     Patient ID: Patricia Davidson, female   DOB: [birth year], 61 y.o.   MRN: [id number]  HPI This 61 year old female was referred by Delray Alt, PA for evaluation of varicose veins with a recent bleed. She was referred from urgent care center. She had 1 episode which occurred 5 days ago and a second episode which occurred 3 days ago. She had a small "scab" near the right ankle area which was dislodged and spurting occurred from this area. She communicated with her physician in [place] who advised her to seek medical attention. She has no history of DVT thrombophlebitis stasis ulcers or significant edema. She does not elastic compression stockings. She's had no previous episodes of bleeding.   Past Medical History:  Diagnosis Date  . Anemia   . Arthritis   . Depression   . Distal radius fracture, right    intra-articular  . Headache   . Hypercholesterolemia   . Hypertension     Social History  Substance Use Topics  . Smoking status: Never Smoker  . Smokeless tobacco: Never Used  . Alcohol use No    Family History  Problem Relation Age of Onset  . Heart disease Mother   . Heart disease Father   . Stroke Father     Allergies  Allergen Reactions  . Sulfa Antibiotics Other (See Comments)    Childhood allergy (unsure of reaction)  . Sulfamethoxazole Other (See Comments)    Unknown reaction as child     Current Outpatient Prescriptions:  .  amphetamine-dextroamphetamine (ADDERALL XR) 30 MG 24 hr capsule, Take 30 mg by mouth every morning., Disp: , Rfl:  .  amphetamine-dextroamphetamine (ADDERALL) 10 MG tablet, Take 10 mg by mouth 2 (two) times daily as needed (as directed). , Disp: , Rfl:  .  aspirin EC 81 MG tablet, Take 81 mg by mouth daily., Disp: , Rfl:  .  atenolol (TENORMIN) 50 MG tablet, Take 50 mg by mouth at bedtime. , Disp: , Rfl:  .  atorvastatin (LIPITOR) 40 MG tablet, Take 40 mg by mouth at bedtime., Disp: , Rfl:  .  calcium-vitamin D (OSCAL WITH  D) 500-200 MG-UNIT tablet, Take 1 tablet by mouth at bedtime., Disp: , Rfl:  .  docusate sodium (COLACE) 100 MG capsule, Take 1 capsule (100 mg total) by mouth 2 (two) times daily., Disp: 10 capsule, Rfl: 0 .  DULoxetine (CYMBALTA) 30 MG capsule, Take 90 mg by mouth at bedtime., Disp: , Rfl:  .  hydrochlorothiazide (MICROZIDE) 12.5 MG capsule, Take 12.5 mg by mouth daily., Disp: , Rfl:  .  HYDROcodone-acetaminophen (NORCO) 10-325 MG tablet, Take 1 tablet by mouth every 6 (six) hours as needed (for breakthrough pain)., Disp: , Rfl:  .  methocarbamol (ROBAXIN) 500 MG tablet, Take 1 tablet (500 mg total) by mouth 3 (three) times daily., Disp: 30 tablet, Rfl: 0 .  vitamin C (ASCORBIC ACID) 500 MG tablet, Take 1 tablet (500 mg total) by mouth daily., Disp: 50 tablet, Rfl: 0 .  amoxicillin (AMOXIL) 500 MG capsule, Take 2,000 mg by mouth See admin instructions. One hour prior to dental appointments, Disp: , Rfl:  .  morphine (MS CONTIN) 100 MG 12 hr tablet, Take 100 mg by mouth at bedtime., Disp: , Rfl:   Vitals:   03/05/17 1520  BP: 136/82  Pulse: (!) 105  Resp: 14  Temp: 98.6 F (37 C)  SpO2: 99%  Height: 6' (1.829 m)    There is no height or  weight on file to calculate BMI.         Review of Systems Denies chest pain, dyspnea on exertion, PND, orthopnea, hemoptysis, claudication    Objective:   Physical Exam BP 136/82 (BP Location: Left Arm, Patient Position: Sitting, Cuff Size: Large)   Pulse (!) 105   Temp 98.6 F (37 C)   Resp 14   Ht 6' (1.829 m)   SpO2 99%     Gen.-alert and oriented x3 in no apparent distress HEENT normal for age Lungs no rhonchi or wheezing Cardiovascular regular rhythm no murmurs carotid pulses 3+ palpable no bruits audible Abdomen soft nontender no palpable masses Musculoskeletal free of  major deformities Skin clear -no rashes Neurologic normal Lower extremities 3+ femoral and dorsalis pedis pulses palpable bilaterally with no edema Right  leg has area of reticular veins about 6 cm proximal to medial malleolus with small eschar overlying the bleeding site. This does not appear infected. There is also a patch of spider telangiectasia in the medial distal right thigh and lateral distal thigh. No hyperpigmentation or ulceration is noted and no bulging varicosities are noted  Today I performed a bedside SonoSite ultrasound exam of the right leg which revealed a normal-sized great saphenous vein with no evidence of reflux       Assessment:     2 episodes of bleeding from reticular vein right ankle with no evidence of reflux right great saphenous vein    Plan:     Patient needs foam sclerotherapy of bleeding site to prevent further episodes We will proceed with precertification to perform this as soon as possible to avoid further bleeding episodes and trips to the urgent care center

## 2017-03-27 ENCOUNTER — Ambulatory Visit (INDEPENDENT_AMBULATORY_CARE_PROVIDER_SITE_OTHER): Payer: Self-pay | Admitting: *Deleted

## 2017-03-27 DIAGNOSIS — I83893 Varicose veins of bilateral lower extremities with other complications: Secondary | ICD-10-CM

## 2017-03-27 DIAGNOSIS — I868 Varicose veins of other specified sites: Secondary | ICD-10-CM

## 2017-03-27 NOTE — Progress Notes (Signed)
X=.3% Sotradecol administered with a 27g butterfly.  Patient received a total of 6cc.  Treated area that bled on 02/28/17 and a few other spiders on the right leg. Easy access. Tol well. Will follow prn.    Compression stockings applied: Yes.  and aces

## 2017-03-28 ENCOUNTER — Telehealth: Payer: Self-pay | Admitting: *Deleted

## 2017-03-28 NOTE — Telephone Encounter (Signed)
Pt had left a message overnight c/o pain in the right leg where sclero was performed on bleed sites. I had applied ace wraps over the knee high to try to encourage compression and closure of the veins. I wrapped over the foot and then up the leg. I instructed her to remove the ace wraps, take some Tylenol (not Ibuprofen) and try to elevate when she isn't walking around. Asked her to call back if these measures don't help. Follow prn.

## 2017-04-13 ENCOUNTER — Telehealth: Payer: Self-pay | Admitting: Surgery

## 2017-04-13 ENCOUNTER — Other Ambulatory Visit: Payer: Self-pay | Admitting: Surgery

## 2017-04-15 ENCOUNTER — Ambulatory Visit (INDEPENDENT_AMBULATORY_CARE_PROVIDER_SITE_OTHER): Payer: Self-pay | Admitting: Vascular Surgery

## 2017-04-15 ENCOUNTER — Encounter: Payer: Self-pay | Admitting: Vascular Surgery

## 2017-04-15 VITALS — BP 144/81 | HR 78 | Temp 97.4°F | Resp 18 | Ht 72.0 in | Wt 255.0 lb

## 2017-04-15 DIAGNOSIS — I83891 Varicose veins of right lower extremities with other complications: Secondary | ICD-10-CM

## 2017-04-15 NOTE — Progress Notes (Signed)
Subjective:     Patient ID: Patricia Davidson, female   DOB: 09/13/1955, 61 y.o.   MRN: [id number]  HPI This 61 year old female had foam sclerotherapy by Marisue IvanLiz work on 03/27/2017 for a small reticular vein proximal to the right medial malleolus where 2 bleeding episodes had occurred. She had no significant enlargement or reflux in the great saphenous vein. She developed some tenderness and redness a few days ago and was started on Keflex by Dr. Myra GianottiBrabham after a phone call. She comes to the office today for further evaluation. She has had no chills and fever nor significant swelling in the calf and ankle area. The redness surrounding the wound is stable and has not enlarged  Past Medical History:  Diagnosis Date  . Anemia   . Arthritis   . Depression   . Distal radius fracture, right    intra-articular  . Headache   . Hypercholesterolemia   . Hypertension     Social History  Substance Use Topics  . Smoking status: Never Smoker  . Smokeless tobacco: Never Used  . Alcohol use No    Family History  Problem Relation Age of Onset  . Heart disease Mother   . Heart disease Father   . Stroke Father     Allergies  Allergen Reactions  . Sulfa Antibiotics Other (See Comments)    Childhood allergy (unsure of reaction)  . Sulfamethoxazole Other (See Comments)    Unknown reaction as child     Current Outpatient Prescriptions:  .  amphetamine-dextroamphetamine (ADDERALL XR) 30 MG 24 hr capsule, Take 30 mg by mouth every morning., Disp: , Rfl:  .  amphetamine-dextroamphetamine (ADDERALL) 10 MG tablet, Take 10 mg by mouth 2 (two) times daily as needed (as directed). , Disp: , Rfl:  .  aspirin EC 81 MG tablet, Take 81 mg by mouth daily., Disp: , Rfl:  .  atenolol (TENORMIN) 50 MG tablet, Take 50 mg by mouth at bedtime. , Disp: , Rfl:  .  atorvastatin (LIPITOR) 40 MG tablet, Take 40 mg by mouth at bedtime., Disp: , Rfl:  .  calcium-vitamin D (OSCAL WITH D) 500-200 MG-UNIT tablet, Take 1 tablet  by mouth at bedtime., Disp: , Rfl:  .  cephALEXin (KEFLEX) 500 MG capsule, Take 500 mg by mouth 3 (three) times daily., Disp: , Rfl:  .  docusate sodium (COLACE) 100 MG capsule, Take 1 capsule (100 mg total) by mouth 2 (two) times daily., Disp: 10 capsule, Rfl: 0 .  DULoxetine (CYMBALTA) 30 MG capsule, Take 90 mg by mouth at bedtime., Disp: , Rfl:  .  hydrochlorothiazide (MICROZIDE) 12.5 MG capsule, Take 12.5 mg by mouth daily., Disp: , Rfl:  .  HYDROcodone-acetaminophen (NORCO) 10-325 MG tablet, Take 1 tablet by mouth every 6 (six) hours as needed (for breakthrough pain)., Disp: , Rfl:  .  methocarbamol (ROBAXIN) 500 MG tablet, Take 1 tablet (500 mg total) by mouth 3 (three) times daily., Disp: 30 tablet, Rfl: 0 .  morphine (MS CONTIN) 100 MG 12 hr tablet, Take 100 mg by mouth at bedtime., Disp: , Rfl:  .  vitamin C (ASCORBIC ACID) 500 MG tablet, Take 1 tablet (500 mg total) by mouth daily., Disp: 50 tablet, Rfl: 0 .  amoxicillin (AMOXIL) 500 MG capsule, Take 2,000 mg by mouth See admin instructions. One hour prior to dental appointments, Disp: , Rfl:   Vitals:   04/15/17 1312 04/15/17 1313  BP: (!) 156/82 (!) 144/81  Pulse: 78   Resp: 18  Temp: (!) 97.4 F (36.3 C)   TempSrc: Oral   SpO2: 96%   Weight: 255 lb (115.7 kg)   Height: 6' (1.829 m)     Body mass index is 34.58 kg/m.           Review of Systems     Objective:   Physical Exam BP (!) 144/81 (BP Location: Left Arm, Patient Position: Sitting, Cuff Size: Large)   Pulse 78   Temp (!) 97.4 F (36.3 C) (Oral)   Resp 18   Ht 6' (1.829 m)   Wt 255 lb (115.7 kg)   SpO2 96%   BMI 34.58 kg/m   Gen. well-developed well-nourished female no apparent distress alert and oriented 3 Right lower extremity with 2+ dorsalis pedis pulse palpable. Small eschar measuring 2 mm x 7 mm surrounded by 1 cm of erythema which is mildly tender to touch. This is located about 8 cm proximal to the medial malleolus. There is no  drainage.  Today I visualized the great saphenous vein with the SonoSite ultrasound both proximal and distal to this site and did localize an incompetent perforating branch which was supplying this area area the area itself seemed to be occluded with patency of the saphenous vein which was small in caliber proximal and distal to this point     Assessment:     Localized cellulitis surrounding area of foam sclerotherapy reticular vein right ankle which had bled on 2 occasions-performed on 03/27/2017-improving on oral antibiotics    Plan:     If patient develops chills or fever or proximal extension of redness she will be in touch with [place]. Also she developed severe swelling in the calf and ankle area she will notify us. This process seems to be stable and improving and should resolve over the next 3-6 weeks Recommended to apply warm compresses if needed for pain relief and mild analgesics Continue Keflex for the one week. And then discontinue We will see her back on a when necessary basis

## 2017-04-16 NOTE — Telephone Encounter (Signed)
m °

## 2017-05-08 ENCOUNTER — Telehealth: Payer: Self-pay | Admitting: *Deleted

## 2017-05-08 NOTE — Telephone Encounter (Signed)
Mrs. Patricia Davidson is s/p sclerotherapy 03-27-2017 by Clementeen HoofLiz Wert RN and last seen by Dr. Hart RochesterLawson on 04-15-2017.  Mrs. Patricia Davidson states she still has red area with scab in center around injection site near R ankle several weeks after completing Keflex.  States now the area is "painful, puffy, and tight."  She denies fever, chills, drainage from site, or streaking of skin.  Encouraged her to use warm compress over site and to use topical antibiotic ointment.  Scheduled next available appointment with Dr. Hart RochesterLawson 05-13-2017 at 1:45PM.

## 2017-05-13 ENCOUNTER — Ambulatory Visit: Payer: BLUE CROSS/BLUE SHIELD | Admitting: Vascular Surgery

## 2017-06-25 DIAGNOSIS — Z9289 Personal history of other medical treatment: Secondary | ICD-10-CM

## 2017-06-25 HISTORY — PX: OTHER SURGICAL HISTORY: SHX169

## 2017-06-25 HISTORY — DX: Personal history of other medical treatment: Z92.89

## 2017-06-25 HISTORY — PX: COLONOSCOPY: SHX174

## 2017-09-26 ENCOUNTER — Encounter (HOSPITAL_COMMUNITY): Payer: Self-pay | Admitting: Emergency Medicine

## 2017-09-26 ENCOUNTER — Ambulatory Visit (HOSPITAL_COMMUNITY)
Admission: EM | Admit: 2017-09-26 | Discharge: 2017-09-26 | Disposition: A | Discharge status: Home / Self Care | Payer: BLUE CROSS/BLUE SHIELD | Attending: Family Medicine | Admitting: Family Medicine

## 2017-09-26 ENCOUNTER — Ambulatory Visit (INDEPENDENT_AMBULATORY_CARE_PROVIDER_SITE_OTHER): Payer: BLUE CROSS/BLUE SHIELD

## 2017-09-26 ENCOUNTER — Other Ambulatory Visit: Payer: Self-pay

## 2017-09-26 DIAGNOSIS — M25531 Pain in right wrist: Secondary | ICD-10-CM

## 2017-09-26 DIAGNOSIS — S8001XA Contusion of right knee, initial encounter: Secondary | ICD-10-CM

## 2017-09-26 DIAGNOSIS — S0093XA Contusion of unspecified part of head, initial encounter: Secondary | ICD-10-CM

## 2017-09-26 DIAGNOSIS — W19XXXA Unspecified fall, initial encounter: Secondary | ICD-10-CM | POA: Diagnosis not present

## 2017-09-26 DIAGNOSIS — S0083XA Contusion of other part of head, initial encounter: Secondary | ICD-10-CM

## 2017-09-26 MED ORDER — NAPROXEN 375 MG PO TABS: 375.0000 mg | ORAL_TABLET | Freq: Two times a day (BID) | ORAL | 0 refills | Status: DC

## 2017-09-26 NOTE — ED Triage Notes (Signed)
Patient reports knee gives away easily.  Knee gave away running down steps at home, outside.  Reports glasses broke.  No loc.  No witness to fall, no blood thinners.  Right knee swelling and right wrist soreness.

## 2017-09-26 NOTE — ED Provider Notes (Signed)
[facility]    CSN: [id number] Arrival date & time: 09/26/17  1608     History   Chief Complaint Chief Complaint  Patient presents with  . Fall    HPI Rivky Clendenning is a 62 y.o. female.   Zariah presents with complaints of a fall this afternoon prior to arrival. States she was rushing down her back steps and her right knee gave out causing her to fall forward onto her brick sidewalk striking her forehead and landing on her right side. She denies any known loss of consciousness. Recalls the event without difficulty, stating she heard her body strike the ground and then laid there for a moment before getting herself up. She has since been ambulatory. Her glasses broke. She has not had a headache, or had nausea or vomiting. She does feel tired. She has pain to her right wrist as well as bruising to her right knee. She has had bilateral knees replaced and has had a previous fracture and surgery to her right wrist. Pain is 2/10. Has not taken any additional medication for pain. She takes morphine er and hydrocodone for chronic abdominal pain. Denies confusion, disorientation. States her neck is becoming sore but it is without pain. Hx of arthritis, htn, headaches, chronic abd pain.    ROS per HPI.      Past Medical History:  Diagnosis Date  . Anemia   . Arthritis   . Depression   . Distal radius fracture, right    intra-articular  . Headache   . Hypercholesterolemia   . Hypertension     Patient Active Problem List   Diagnosis Date Noted  . 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    Past Surgical History:  Procedure Laterality Date  . ABDOMINAL HYSTERECTOMY    . ADENOIDECTOMY    . APPENDECTOMY    . COLONOSCOPY    . DILATION AND CURETTAGE OF UTERUS    . KNEE ARTHROPLASTY    . OPEN REDUCTION INTERNAL FIXATION (ORIF) DISTAL RADIAL FRACTURE Right 08/18/2016   Procedure: RIGHT DISTAL RADIUS OPEN REDUCTION INTERNAL FIXATION AND  REPAIR AS INDICATED;  Surgeon: Bradly Bienenstock, MD;  Location: [facility];  Service: Orthopedics;  Laterality: Right;  . TONSILLECTOMY    . TUBAL LIGATION      OB History   None      Home Medications    Prior to Admission medications   Medication Sig Start Date End Date Taking? Authorizing Provider  amoxicillin (AMOXIL) 500 MG capsule Take 2,000 mg by mouth See admin instructions. One hour prior to dental appointments    [provider]  amphetamine-dextroamphetamine (ADDERALL XR) 30 MG 24 hr capsule Take 30 mg by mouth every morning.    [provider]  amphetamine-dextroamphetamine (ADDERALL) 10 MG tablet Take 10 mg by mouth 2 (two) times daily as needed (as directed).     [provider]  aspirin EC 81 MG tablet Take 81 mg by mouth daily.    [provider]  atenolol (TENORMIN) 50 MG tablet Take 50 mg by mouth at bedtime.     [provider]  atorvastatin (LIPITOR) 40 MG tablet Take 40 mg by mouth at bedtime.    [provider]  calcium-vitamin D (OSCAL WITH D) 500-200 MG-UNIT tablet Take 1 tablet by mouth at bedtime.    [provider]  cephALEXin (KEFLEX) 500 MG capsule Take 500 mg by mouth 3 (three) times daily.    [provider]  docusate sodium (COLACE) 100 MG capsule Take 1 capsule (100 mg total) by mouth 2 (two) times daily. 08/18/16   Bradly Bienenstock, MD  DULoxetine (CYMBALTA) 30 MG capsule Take 90 mg by mouth at bedtime.    [provider]  hydrochlorothiazide (MICROZIDE) 12.5 MG capsule Take 12.5 mg by mouth daily.    [provider]  HYDROcodone-acetaminophen (NORCO) 10-325 MG tablet Take 1 tablet by mouth every 6 (six) hours as needed (for breakthrough pain).    [provider]  methocarbamol (ROBAXIN) 500 MG tablet Take 1 tablet (500 mg total) by mouth 3 (three) times daily. 08/18/16   Bradly Bienenstock, MD  morphine (MS CONTIN) 100 MG 12 hr tablet Take 100 mg by mouth at bedtime.     [provider]  vitamin C (ASCORBIC ACID) 500 MG tablet Take 1 tablet (500 mg total) by mouth daily. 08/18/16   Bradly Bienenstock, MD    Family History Family History  Problem Relation Age of Onset  . Heart disease Mother   . Heart disease Father   . Stroke Father     Social History Social History   Tobacco Use  . Smoking status: Never Smoker  . Smokeless tobacco: Never Used  Substance Use Topics  . Alcohol use: No  . Drug use: No     Allergies   Sulfa antibiotics and Sulfamethoxazole   Review of Systems Review of Systems   Physical Exam Triage Vital Signs ED Triage Vitals  Enc Vitals Group     BP 09/26/17 1632 130/84     Pulse Rate 09/26/17 1632 67     Resp 09/26/17 1632 18     Temp 09/26/17 1632 98.4 F (36.9 C)     Temp Source 09/26/17 1632 Oral     SpO2 09/26/17 1632 97 %     Weight --      Height --      Head Circumference --      Peak Flow --      Pain Score 09/26/17 1630 2     Pain Loc --      Pain Edu? --      Excl. in GC? --    No data found.  Updated Vital Signs BP 130/84 (BP Location: Left Arm)   Pulse 67   Temp 98.4 F (36.9 C) (Oral)   Resp 18   SpO2 97%   Visual Acuity Right Eye Distance:   Left Eye Distance:   Bilateral Distance:    Right Eye Near:   Left Eye Near:    Bilateral Near:     Physical Exam  Constitutional: She is oriented to person, place, and time. She appears well-developed and well-nourished. No distress.  HENT:  Head: Head is with abrasion.    Slight swelling and abrasion to forehead noted   Neck: Normal range of motion and full passive range of motion without pain. No spinous process tenderness and no muscular tenderness present. No neck rigidity. No erythema and normal range of motion present.  Cardiovascular: Normal rate, regular rhythm and normal heart sounds.  Pulmonary/Chest: Effort normal and breath sounds normal.  Musculoskeletal:       Right elbow: Normal.      Right wrist: She exhibits  tenderness and bony tenderness. She exhibits normal range of motion, no swelling, no effusion, no crepitus, no deformity and no laceration.       Right knee: She exhibits swelling and ecchymosis. She exhibits normal range of motion, no effusion, no  deformity, no erythema, normal alignment and no LCL laxity. No tenderness found.       Arms:      Right hand: She exhibits tenderness and bony tenderness. She exhibits normal range of motion, normal two-point discrimination, normal capillary refill, no deformity, no laceration and no swelling. Normal sensation noted. Normal strength noted.       Hands:      Legs: Right elbow without tenderness or bruising noted; full ROM noted; right hand with tenderness and bruising to pointer finger and middle finger MCP j; tenderness at first metacarpal; full ROM to fingers; sensation intact; cap refill <2 seconds; wrist with tenderness at distal radius and mild snuff box tenderness; slight pain with all ROM to right wrist; right knee with bruising to lateral knee; minimal to no tenderness, full ROM noted to knee; lower extremity sensation intact, ambulatory.   Neurological: She is alert and oriented to person, place, and time. She has normal strength. She is not disoriented. No cranial nerve deficit or sensory deficit. GCS eye subscore is 4. GCS verbal subscore is 5. GCS motor subscore is 6.  Skin: Skin is warm and dry.     UC Treatments / Results  Labs (all labs ordered are listed, but only abnormal results are displayed) Labs Reviewed - No data to display  EKG None Radiology Dg Wrist Complete Right  Result Date: 09/26/2017 CLINICAL DATA:  Status post fall.  Right wrist pain. EXAM: RIGHT WRIST - COMPLETE 3+ VIEW COMPARISON:  None. FINDINGS: No acute fracture or dislocation. Old ununited fracture of the base of the right ulnar styloid process. Old healed distal radial fracture transfixed with a surgical plate and multiple interlocking screws. Severe osteoarthritis  of the first [facility] joint. Mild osteoarthritis of the scaphotrapeziotrapezoid joint. Mild osteoarthritis of the first MCP joint. IMPRESSION: No acute osseous injury of the right wrist. Electronically Signed   By: Elige Ko   On: 09/26/2017 17:29   Dg Knee Complete 4 Views Right  Result Date: 09/26/2017 CLINICAL DATA:  RIGHT knee gave way at home, braced her fall with her hand, anterior RIGHT knee pain EXAM: RIGHT KNEE - COMPLETE 4+ VIEW COMPARISON:  None FINDINGS: Osseous mineralization low normal. Components of a RIGHT knee prosthesis are identified. Lateral tibial stable. No acute fracture, dislocation, bone destruction or periprosthetic lucency. Knee joint effusion present. Anterior infrapatellar soft tissue versus hematoma. IMPRESSION: RIGHT knee prosthesis without acute complication. RIGHT knee joint effusion. Anterior infrapatellar soft tissue swelling versus hematoma. Electronically Signed   By: Ulyses Southward M.D.   On: 09/26/2017 17:29    Procedures Procedures (including critical care time)  Medications Ordered in UC Medications - No data to display   Initial Impression / Assessment and Plan / UC Course  I have reviewed the triage vital signs and the nursing notes.  Pertinent labs & imaging results that were available during my care of the patient were reviewed by me and considered in my medical decision making (see chart for details).     Without neurological findings on exam. Alert and oriented. Did not lose consciousness. Is not on any blood thinning medications. Without vomiting or nausea. Denies headache. xrays without acute findings. Mild snuff box tenderness, opted to place splint at this time. Ice, elevation, naproxen for pain control. Follow up with orthopedics. Return precautions provided. Patient verbalized understanding and agreeable to plan.  Ambulatory out of clinic without difficulty.     Case discussed with supervising physician dr. Tracie Harrier  Final Clinical Impressions(s)  /  UC Diagnoses   Final diagnoses:  Fall, initial encounter  Contusion of face, initial encounter  Contusion of right knee, initial encounter  Wrist pain, acute, right    ED Discharge Orders    None       Controlled Substance Prescriptions [place] Controlled Substance Registry consulted? Not Applicable   Georgetta HaberBurky, Kwana Ringel B, NP 09/26/17 [phone]

## 2017-09-26 NOTE — Discharge Instructions (Signed)
Ice, elevation to right wrist and knee for pain control. Naproxen twice a day, take with food. Use of right wrist splint until cleared by orthopedics. Please follow up with orthopedics in regards to wrist and knee pain. Activity as tolerated. If develop dizziness, nausea, confusion, disorientation, weakness, vomiting, headache or otherwise worsening please go to the Er.

## 2018-05-31 ENCOUNTER — Emergency Department (HOSPITAL_COMMUNITY)
Admission: EM | Admit: 2018-05-31 | Discharge: 2018-05-31 | Disposition: A | Discharge status: Home / Self Care | Payer: BLUE CROSS/BLUE SHIELD | Attending: Emergency Medicine | Admitting: Emergency Medicine

## 2018-05-31 ENCOUNTER — Other Ambulatory Visit: Payer: Self-pay

## 2018-05-31 ENCOUNTER — Emergency Department (HOSPITAL_COMMUNITY): Payer: BLUE CROSS/BLUE SHIELD

## 2018-05-31 ENCOUNTER — Encounter (HOSPITAL_COMMUNITY): Payer: Self-pay | Admitting: Pharmacy Technician

## 2018-05-31 DIAGNOSIS — Z79899 Other long term (current) drug therapy: Secondary | ICD-10-CM | POA: Diagnosis not present

## 2018-05-31 DIAGNOSIS — M25552 Pain in left hip: Secondary | ICD-10-CM

## 2018-05-31 DIAGNOSIS — I10 Essential (primary) hypertension: Secondary | ICD-10-CM | POA: Diagnosis not present

## 2018-05-31 LAB — I-STAT TROPONIN, ED: Troponin i, poc: 0.01 ng/mL (ref 0.00–0.08)

## 2018-05-31 MED ORDER — METHOCARBAMOL 500 MG PO TABS: 500.0000 mg | ORAL_TABLET | Freq: Two times a day (BID) | ORAL | 0 refills | Status: DC | PRN

## 2018-05-31 MED ORDER — IBUPROFEN 600 MG PO TABS: 600.0000 mg | ORAL_TABLET | Freq: Four times a day (QID) | ORAL | 0 refills | Status: DC | PRN

## 2018-05-31 NOTE — Discharge Instructions (Signed)
Please see your orthopedist or you may see Dr. Charlann Boxerlin who is a known Tax adviser[facility] surgeon in town. Ibuprofen 3 times daily at the high dose, Robaxin twice a day, you will need to be initiated on physical therapy and see an orthopedist within the next several days.  ER for increasing pain fever or vomiting or any numbness or weakness of the leg.

## 2018-05-31 NOTE — ED Notes (Signed)
PT states understanding of care given, follow up care, and medication prescribed. PT ambulated from ED to car with a steady gait. 

## 2018-05-31 NOTE — ED Triage Notes (Signed)
Pt with complaints of L hip pain onset a month ago when she was walking. Thought it was groin pain. Pain continued to worsen. Pt reports palpitations starting approx 2 hours pta with a "twinge" of cp accompanying the palpitations.

## 2018-05-31 NOTE — ED Provider Notes (Signed)
[facility] EMERGENCY DEPARTMENT Provider Note   CSN: [id number] Arrival date & time: 05/31/18  1920     History   Chief Complaint Chief Complaint  Patient presents with  . Palpitations  . Hip Pain    HPI Patricia Davidson is a 62 y.o. female.  HPI  62 y/o female - no hx of L hip pain - for the last 2 weeks she has had a progressive pain in the L hip and groin - that started gradually - has gotten worse and now is constant, worse with flexion and internal rotation - worse with sitting, bwetter with laying / standing - radiating to the L buttock and around the L hip - no fevers, no trauma, no numbness or weakness of the leg- is ambulating without difficulty.  No headache, chills, rashes or any other c/o - no diarrhea or dysuria  No masses in the groin.  Has had pain all ove rher body - joitns in hands, knee replacements over time but this is a new pain  Past Medical History:  Diagnosis Date  . Anemia   . Arthritis   . Depression   . Distal radius fracture, right    intra-articular  . Headache   . Hypercholesterolemia   . Hypertension     Patient Active Problem List   Diagnosis Date Noted  . 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    Past Surgical History:  Procedure Laterality Date  . ABDOMINAL HYSTERECTOMY    . ADENOIDECTOMY    . APPENDECTOMY    . COLONOSCOPY    . DILATION AND CURETTAGE OF UTERUS    . KNEE ARTHROPLASTY    . OPEN REDUCTION INTERNAL FIXATION (ORIF) DISTAL RADIAL FRACTURE Right 08/18/2016   Procedure: RIGHT DISTAL RADIUS OPEN REDUCTION INTERNAL FIXATION AND REPAIR AS INDICATED;  Surgeon: Bradly Bienenstock, MD;  Location: [facility];  Service: Orthopedics;  Laterality: Right;  . TONSILLECTOMY    . TUBAL LIGATION       OB History   None      Home Medications    Prior to Admission medications   Medication Sig Start Date End Date Taking? Authorizing Provider  amoxicillin (AMOXIL) 500 MG capsule Take 2,000  mg by mouth See admin instructions. One hour prior to dental appointments   Yes [provider]  amphetamine-dextroamphetamine (ADDERALL XR) 30 MG 24 hr capsule Take 30 mg by mouth every morning.   Yes [provider]  amphetamine-dextroamphetamine (ADDERALL) 10 MG tablet Take 10 mg by mouth 2 (two) times daily as needed (as directed).    Yes [provider]  aspirin EC 81 MG tablet Take 81 mg by mouth daily.   Yes [provider]  atenolol (TENORMIN) 50 MG tablet Take 50 mg by mouth at bedtime.    Yes [provider]  atorvastatin (LIPITOR) 40 MG tablet Take 40 mg by mouth at bedtime.   Yes [provider]  calcium-vitamin D (OSCAL WITH D) 500-200 MG-UNIT tablet Take 1 tablet by mouth at bedtime.   Yes [provider]  DULoxetine (CYMBALTA) 30 MG capsule Take 90 mg by mouth at bedtime.   Yes [provider]  hydrochlorothiazide (MICROZIDE) 12.5 MG capsule Take 12.5 mg by mouth daily.   Yes [provider]  HYDROcodone-acetaminophen (NORCO) 10-325 MG tablet Take 1 tablet by mouth every 6 (six) hours as needed (for breakthrough pain).   Yes [provider]  MORPHINE SULFATE ER PO Take 120 mg  by mouth at bedtime.    Yes [provider]  ibuprofen (ADVIL,MOTRIN) 600 MG tablet Take 1 tablet (600 mg total) by mouth every 6 (six) hours as needed. 05/31/18   Eber Hong, MD  methocarbamol (ROBAXIN) 500 MG tablet Take 1 tablet (500 mg total) by mouth 2 (two) times daily as needed for muscle spasms. 05/31/18   Eber Hong, MD    Family History Family History  Problem Relation Age of Onset  . Heart disease Mother   . Heart disease Father   . Stroke Father     Social History Social History   Tobacco Use  . Smoking status: Never Smoker  . Smokeless tobacco: Never Used  Substance Use Topics  . Alcohol use: No  . Drug use: No     Allergies   Sulfa antibiotics and Sulfamethoxazole   Review of  Systems Review of Systems  All other systems reviewed and are negative.    Physical Exam Updated Vital Signs BP (!) 141/69   Pulse 65   Resp 16   Ht 1.829 m (6')   Wt 115.7 kg   SpO2 99%   BMI 34.58 kg/m   Physical Exam  Constitutional: She appears well-developed and well-nourished. No distress.  HENT:  Head: Normocephalic and atraumatic.  Mouth/Throat: Oropharynx is clear and moist. No oropharyngeal exudate.  Eyes: Pupils are equal, round, and reactive to light. Conjunctivae and EOM are normal. Right eye exhibits no discharge. Left eye exhibits no discharge. No scleral icterus.  Neck: Normal range of motion. Neck supple. No JVD present. No thyromegaly present.  Cardiovascular: Normal rate, regular rhythm, normal heart sounds and intact distal pulses. Exam reveals no gallop and no friction rub.  No murmur heard. Pulmonary/Chest: Effort normal and breath sounds normal. No respiratory distress. She has no wheezes. She has no rales.  Abdominal: Soft. Bowel sounds are normal. She exhibits no distension and no mass. There is no tenderness.  No abd ttp or masses  Musculoskeletal: She exhibits tenderness. She exhibits no edema.  Groin inpsected under chaperone guidance - no hernia - has normal ROM of the joints of UE and LE except for the hip - not made worse with external rotation, but pain with internal rotation an flextion - better with extention.  No deformtity and no rashes.  Lymphadenopathy:    She has no cervical adenopathy.  Neurological: She is alert. Coordination normal.  Normal strength and sensation in the hip - ttp in the groin - gait is slightly antalgic  Skin: Skin is warm and dry. No rash noted. No erythema.  No rash  Psychiatric: She has a normal mood and affect. Her behavior is normal.  Nursing note and vitals reviewed.    ED Treatments / Results  Labs (all labs ordered are listed, but only abnormal results are displayed) Labs Reviewed  I-STAT TROPONIN, ED     EKG None  Radiology Dg Hip Unilat W Or Wo Pelvis 2-3 Views Left  Result Date: 05/31/2018 CLINICAL DATA:  LEFT hip pain for 2 weeks EXAM: DG HIP (WITH OR WITHOUT PELVIS) 2-3V LEFT COMPARISON:  None. FINDINGS: Hips are located. No evidence of pelvic fracture or sacral fracture. Dedicated view of the LEFT hip demonstrates no femoral neck fracture. Mild sclerosis of the acetabular roofs. IMPRESSION: No fracture dislocation. 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 Electronically Signed   By: Genevive Bi M.D.   On: 05/31/2018 21:18    Procedures Procedures (including critical care time)  Medications Ordered in  ED Medications - No data to display   Initial Impression / Assessment and Plan / ED Course  I have reviewed the triage vital signs and the nursing notes.  Pertinent labs & imaging results that were available during my care of the patient were reviewed by me and considered in my medical decision making (see chart for details).     Radiology ordered,  R/o pelvic fracture - but has likely muscular injury / strain, no neuro sx - less liekly to be lumbar  Radiculopathy - normal n/v exam.  Xray neg - pt stable Can f/u with ortho  Final Clinical Impressions(s) / ED Diagnoses   Final diagnoses:  Pain of left hip joint    ED Discharge Orders         Ordered    ibuprofen (ADVIL,MOTRIN) 600 MG tablet  Every 6 hours PRN     05/31/18 2232    methocarbamol (ROBAXIN) 500 MG tablet  2 times daily PRN     05/31/18 2232           Eber HongMiller, Aarti Mankowski, MD 05/31/18 2236

## 2018-06-19 ENCOUNTER — Emergency Department (HOSPITAL_COMMUNITY): Payer: BLUE CROSS/BLUE SHIELD

## 2018-06-19 ENCOUNTER — Encounter (HOSPITAL_COMMUNITY): Payer: Self-pay

## 2018-06-19 ENCOUNTER — Inpatient Hospital Stay (HOSPITAL_COMMUNITY)
Admission: EM | Admit: 2018-06-19 | Discharge: 2018-06-24 | DRG: 605 | Disposition: A | Discharge status: Home / Self Care | Payer: BLUE CROSS/BLUE SHIELD | Attending: Surgery | Admitting: Surgery

## 2018-06-19 ENCOUNTER — Other Ambulatory Visit: Payer: Self-pay

## 2018-06-19 DIAGNOSIS — Z79899 Other long term (current) drug therapy: Secondary | ICD-10-CM | POA: Diagnosis not present

## 2018-06-19 DIAGNOSIS — Z8249 Family history of ischemic heart disease and other diseases of the circulatory system: Secondary | ICD-10-CM | POA: Diagnosis not present

## 2018-06-19 DIAGNOSIS — D62 Acute posthemorrhagic anemia: Secondary | ICD-10-CM | POA: Diagnosis present

## 2018-06-19 DIAGNOSIS — Z9851 Tubal ligation status: Secondary | ICD-10-CM

## 2018-06-19 DIAGNOSIS — S52351A Displaced comminuted fracture of shaft of radius, right arm, initial encounter for closed fracture: Secondary | ICD-10-CM

## 2018-06-19 DIAGNOSIS — Z90710 Acquired absence of both cervix and uterus: Secondary | ICD-10-CM | POA: Diagnosis not present

## 2018-06-19 DIAGNOSIS — R339 Retention of urine, unspecified: Secondary | ICD-10-CM | POA: Diagnosis present

## 2018-06-19 DIAGNOSIS — Z7982 Long term (current) use of aspirin: Secondary | ICD-10-CM

## 2018-06-19 DIAGNOSIS — S80211A Abrasion, right knee, initial encounter: Secondary | ICD-10-CM | POA: Diagnosis present

## 2018-06-19 DIAGNOSIS — Z882 Allergy status to sulfonamides status: Secondary | ICD-10-CM

## 2018-06-19 DIAGNOSIS — G8929 Other chronic pain: Secondary | ICD-10-CM | POA: Diagnosis present

## 2018-06-19 DIAGNOSIS — I10 Essential (primary) hypertension: Secondary | ICD-10-CM | POA: Diagnosis present

## 2018-06-19 DIAGNOSIS — M1990 Unspecified osteoarthritis, unspecified site: Secondary | ICD-10-CM | POA: Diagnosis present

## 2018-06-19 DIAGNOSIS — E785 Hyperlipidemia, unspecified: Secondary | ICD-10-CM | POA: Diagnosis present

## 2018-06-19 DIAGNOSIS — Z23 Encounter for immunization: Secondary | ICD-10-CM

## 2018-06-19 DIAGNOSIS — F329 Major depressive disorder, single episode, unspecified: Secondary | ICD-10-CM | POA: Diagnosis present

## 2018-06-19 DIAGNOSIS — S52501A Unspecified fracture of the lower end of right radius, initial encounter for closed fracture: Secondary | ICD-10-CM | POA: Diagnosis present

## 2018-06-19 DIAGNOSIS — S80212A Abrasion, left knee, initial encounter: Secondary | ICD-10-CM | POA: Diagnosis present

## 2018-06-19 DIAGNOSIS — Y92410 Unspecified street and highway as the place of occurrence of the external cause: Secondary | ICD-10-CM

## 2018-06-19 DIAGNOSIS — S301XXA Contusion of abdominal wall, initial encounter: Principal | ICD-10-CM | POA: Diagnosis present

## 2018-06-19 LAB — COMPREHENSIVE METABOLIC PANEL
ALT: 30 U/L (ref 0–44)
AST: 33 U/L (ref 15–41)
Albumin: 3.5 g/dL (ref 3.5–5.0)
Alkaline Phosphatase: 72 U/L (ref 38–126)
Anion gap: 6 (ref 5–15)
BUN: 13 mg/dL (ref 8–23)
CO2: 31 mmol/L (ref 22–32)
Calcium: 8.8 mg/dL — ABNORMAL LOW (ref 8.9–10.3)
Chloride: 103 mmol/L (ref 98–111)
Creatinine, Ser: 0.59 mg/dL (ref 0.44–1.00)
GFR calc Af Amer: >60 mL/min (ref >60)
GFR calc non Af Amer: >60 mL/min (ref >60)
Glucose, Bld: 103 mg/dL — ABNORMAL HIGH (ref 70–99)
POTASSIUM: 3.4 mmol/L — AB (ref 3.5–5.1)
SODIUM: 140 mmol/L (ref 135–145)
Total Bilirubin: 0.6 mg/dL (ref 0.3–1.2)
Total Protein: 6.7 g/dL (ref 6.5–8.1)

## 2018-06-19 LAB — I-STAT TROPONIN, ED: Troponin i, poc: 0 ng/mL (ref 0.00–0.08)

## 2018-06-19 LAB — CBC WITH DIFFERENTIAL/PLATELET
Abs Immature Granulocytes: 0.04 10*3/uL (ref 0.00–0.07)
BASOS PCT: 1 %
Basophils Absolute: 0 10*3/uL (ref 0.0–0.1)
Eosinophils Absolute: 0.1 10*3/uL (ref 0.0–0.5)
Eosinophils Relative: 2 %
HCT: 32.9 % — ABNORMAL LOW (ref 36.0–46.0)
Hemoglobin: 10.2 g/dL — ABNORMAL LOW (ref 12.0–15.0)
IMMATURE GRANULOCYTES: 1 %
Lymphocytes Relative: 29 %
Lymphs Abs: 2 10*3/uL (ref 0.7–4.0)
MCH: 29.6 pg (ref 26.0–34.0)
MCHC: 31 g/dL (ref 30.0–36.0)
MCV: 95.4 fL (ref 80.0–100.0)
Monocytes Absolute: 0.7 10*3/uL (ref 0.1–1.0)
Monocytes Relative: 10 %
NEUTROS PCT: 57 %
NRBC: 0 % (ref 0.0–0.2)
Neutro Abs: 4.1 10*3/uL (ref 1.7–7.7)
Platelets: 276 10*3/uL (ref 150–400)
RBC: 3.45 MIL/uL — ABNORMAL LOW (ref 3.87–5.11)
RDW: 13.2 % (ref 11.5–15.5)
WBC: 7.1 10*3/uL (ref 4.0–10.5)

## 2018-06-19 LAB — PROTIME-INR
INR: 0.96
Prothrombin Time: 12.7 seconds (ref 11.4–15.2)

## 2018-06-19 LAB — ETHANOL: Alcohol, Ethyl (B): <10 mg/dL (ref <10)

## 2018-06-19 LAB — ABO/RH: ABO/RH(D): A NEG

## 2018-06-19 MED ORDER — FENTANYL CITRATE (PF) 100 MCG/2ML IJ SOLN
50.0000 ug | Freq: Once | INTRAMUSCULAR | Status: AC
  Administered 2018-06-19: 50 ug via INTRAVENOUS
  Filled 2018-06-19: qty 2

## 2018-06-19 MED ORDER — IOHEXOL 300 MG/ML  SOLN
100.0000 mL | Freq: Once | INTRAMUSCULAR | Status: AC | PRN
  Administered 2018-06-19: 100 mL via INTRAVENOUS

## 2018-06-19 MED ORDER — METHOCARBAMOL 500 MG PO TABS
500.0000 mg | ORAL_TABLET | Freq: Four times a day (QID) | ORAL | Status: DC | PRN
  Administered 2018-06-20: 500 mg via ORAL
  Filled 2018-06-19: qty 1

## 2018-06-19 MED ORDER — ONDANSETRON HCL 4 MG/2ML IJ SOLN: 4.0000 mg | Freq: Four times a day (QID) | INTRAMUSCULAR | Status: DC | PRN

## 2018-06-19 MED ORDER — OXYCODONE HCL 5 MG PO TABS
5.0000 mg | ORAL_TABLET | ORAL | Status: DC | PRN
  Administered 2018-06-20 – 2018-06-21 (×3): 5 mg via ORAL
  Filled 2018-06-19 (×3): qty 1

## 2018-06-19 MED ORDER — ONDANSETRON HCL 4 MG/2ML IJ SOLN
4.0000 mg | Freq: Once | INTRAMUSCULAR | Status: AC
  Administered 2018-06-19: 4 mg via INTRAVENOUS
  Filled 2018-06-19: qty 2

## 2018-06-19 MED ORDER — DOCUSATE SODIUM 100 MG PO CAPS
100.0000 mg | ORAL_CAPSULE | Freq: Two times a day (BID) | ORAL | Status: DC
  Administered 2018-06-20 – 2018-06-23 (×9): 100 mg via ORAL
  Filled 2018-06-19 (×10): qty 1

## 2018-06-19 MED ORDER — DULOXETINE HCL 60 MG PO CPEP
90.0000 mg | ORAL_CAPSULE | Freq: Every day | ORAL | Status: DC
  Administered 2018-06-20 – 2018-06-23 (×5): 90 mg via ORAL
  Filled 2018-06-19 (×5): qty 1

## 2018-06-19 MED ORDER — SODIUM CHLORIDE 0.9 % IV SOLN
INTRAVENOUS | Status: DC
  Administered 2018-06-20 – 2018-06-21 (×4): via INTRAVENOUS

## 2018-06-19 MED ORDER — ACETAMINOPHEN 325 MG PO TABS
650.0000 mg | ORAL_TABLET | ORAL | Status: DC | PRN
  Administered 2018-06-20 – 2018-06-23 (×4): 650 mg via ORAL
  Filled 2018-06-19 (×4): qty 2

## 2018-06-19 MED ORDER — FENTANYL CITRATE (PF) 100 MCG/2ML IJ SOLN
100.0000 ug | Freq: Once | INTRAMUSCULAR | Status: AC
  Administered 2018-06-19: 100 ug via INTRAVENOUS
  Filled 2018-06-19: qty 2

## 2018-06-19 MED ORDER — HYDROMORPHONE HCL 1 MG/ML IJ SOLN
0.5000 mg | INTRAMUSCULAR | Status: DC | PRN
  Administered 2018-06-20 – 2018-06-23 (×7): 0.5 mg via INTRAVENOUS
  Filled 2018-06-19 (×7): qty 1

## 2018-06-19 MED ORDER — ONDANSETRON 4 MG PO TBDP
4.0000 mg | ORAL_TABLET | Freq: Four times a day (QID) | ORAL | Status: DC | PRN
  Administered 2018-06-20: 4 mg via ORAL
  Filled 2018-06-19: qty 1

## 2018-06-19 MED ORDER — GABAPENTIN 300 MG PO CAPS
300.0000 mg | ORAL_CAPSULE | Freq: Three times a day (TID) | ORAL | Status: DC
  Administered 2018-06-20 – 2018-06-24 (×14): 300 mg via ORAL
  Filled 2018-06-19 (×14): qty 1
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
## 2018-06-20 LAB — URINALYSIS, ROUTINE W REFLEX MICROSCOPIC
Bilirubin Urine: NEGATIVE
Glucose, UA: NEGATIVE mg/dL
Ketones, ur: 5 mg/dL — AB
LEUKOCYTES UA: NEGATIVE
Nitrite: NEGATIVE
Protein, ur: 30 mg/dL — AB
RBC / HPF: >50 RBC/hpf — ABNORMAL HIGH (ref 0–5)
Specific Gravity, Urine: >1.046 — ABNORMAL HIGH (ref 1.005–1.030)
pH: 5 (ref 5.0–8.0)

## 2018-06-20 LAB — CBC
HCT: 27.8 % — ABNORMAL LOW (ref 36.0–46.0)
HCT: 27 % — ABNORMAL LOW (ref 36.0–46.0)
HCT: 24.4 % — ABNORMAL LOW (ref 36.0–46.0)
HCT: 22.4 % — ABNORMAL LOW (ref 36.0–46.0)
HEMOGLOBIN: 8.4 g/dL — AB (ref 12.0–15.0)
Hemoglobin: 8.7 g/dL — ABNORMAL LOW (ref 12.0–15.0)
Hemoglobin: 7.6 g/dL — ABNORMAL LOW (ref 12.0–15.0)
Hemoglobin: 7.1 g/dL — ABNORMAL LOW (ref 12.0–15.0)
MCH: 29.7 pg (ref 26.0–34.0)
MCH: 29.7 pg (ref 26.0–34.0)
MCH: 29.7 pg (ref 26.0–34.0)
MCH: 30.5 pg (ref 26.0–34.0)
MCHC: 31.3 g/dL (ref 30.0–36.0)
MCHC: 31.1 g/dL (ref 30.0–36.0)
MCHC: 31.1 g/dL (ref 30.0–36.0)
MCHC: 31.7 g/dL (ref 30.0–36.0)
MCV: 94.9 fL (ref 80.0–100.0)
MCV: 95.4 fL (ref 80.0–100.0)
MCV: 95.3 fL (ref 80.0–100.0)
MCV: 96.1 fL (ref 80.0–100.0)
NRBC: 0 % (ref 0.0–0.2)
NRBC: 0 % (ref 0.0–0.2)
PLATELETS: 295 10*3/uL (ref 150–400)
PLATELETS: 273 10*3/uL (ref 150–400)
Platelets: 285 10*3/uL (ref 150–400)
Platelets: 284 10*3/uL (ref 150–400)
RBC: 2.93 MIL/uL — AB (ref 3.87–5.11)
RBC: 2.83 MIL/uL — ABNORMAL LOW (ref 3.87–5.11)
RBC: 2.56 MIL/uL — ABNORMAL LOW (ref 3.87–5.11)
RBC: 2.33 MIL/uL — ABNORMAL LOW (ref 3.87–5.11)
RDW: 13.2 % (ref 11.5–15.5)
RDW: 13.2 % (ref 11.5–15.5)
RDW: 13.5 % (ref 11.5–15.5)
RDW: 13.7 % (ref 11.5–15.5)
WBC: 13.6 10*3/uL — ABNORMAL HIGH (ref 4.0–10.5)
WBC: 11.1 10*3/uL — ABNORMAL HIGH (ref 4.0–10.5)
WBC: 9.6 10*3/uL (ref 4.0–10.5)
WBC: 8.7 10*3/uL (ref 4.0–10.5)
nRBC: 0 % (ref 0.0–0.2)
nRBC: 0 % (ref 0.0–0.2)

## 2018-06-20 LAB — BASIC METABOLIC PANEL
Anion gap: 11 (ref 5–15)
BUN: 11 mg/dL (ref 8–23)
CALCIUM: 8.2 mg/dL — AB (ref 8.9–10.3)
CO2: 26 mmol/L (ref 22–32)
Chloride: 103 mmol/L (ref 98–111)
Creatinine, Ser: 0.64 mg/dL (ref 0.44–1.00)
GFR calc non Af Amer: >60 mL/min (ref >60)
Glucose, Bld: 156 mg/dL — ABNORMAL HIGH (ref 70–99)
Potassium: 3.6 mmol/L (ref 3.5–5.1)
Sodium: 140 mmol/L (ref 135–145)

## 2018-06-20 LAB — SURGICAL PCR SCREEN
MRSA, PCR: NEGATIVE
Staphylococcus aureus: NEGATIVE

## 2018-06-20 LAB — PREPARE RBC (CROSSMATCH)

## 2018-06-20 LAB — HIV ANTIBODY (ROUTINE TESTING W REFLEX): HIV Screen 4th Generation wRfx: NONREACTIVE

## 2018-06-20 MED ORDER — SODIUM CHLORIDE 0.9% IV SOLUTION
Freq: Once | INTRAVENOUS | Status: AC
  Administered 2018-06-20: 20:00:00 via INTRAVENOUS

## 2018-06-20 MED ORDER — PROMETHAZINE HCL 25 MG/ML IJ SOLN: 12.5000 mg | Freq: Four times a day (QID) | INTRAMUSCULAR | Status: DC | PRN

## 2018-06-20 MED ORDER — PROMETHAZINE HCL 25 MG/ML IJ SOLN
12.5000 mg | Freq: Once | INTRAMUSCULAR | Status: AC
  Administered 2018-06-20: 12.5 mg via INTRAVENOUS
  Filled 2018-06-20: qty 1

## 2018-06-20 MED ORDER — METHOCARBAMOL 500 MG PO TABS
750.0000 mg | ORAL_TABLET | Freq: Four times a day (QID) | ORAL | Status: DC | PRN
  Administered 2018-06-20 – 2018-06-23 (×11): 750 mg via ORAL
  Filled 2018-06-20 (×11): qty 2

## 2018-06-20 NOTE — Progress Notes (Signed)
PT put out 50ml in 7hrs of urine also feeling very nauseated RN could not get a bladder scan volume due to hematoma. RN notified MD on call. MD gave verbal orders for in and out cath and phenergan per MAR. In and out performed with output. MD also notified of pt's new 0109 results from CBC and no new orders received for these results given. Will cont to monitor.

## 2018-06-20 NOTE — ED Notes (Signed)
Patient is in CT scan 

## 2018-06-20 NOTE — Progress Notes (Signed)
Patient ID: Patricia Davidson, female   DOB: 08/29/1955, [age] y.o.   MRN: [id number]  Repeat CBC Lab Results  Component Value Date   WBC 8.7 06/20/2018   HGB 7.1 (L) 06/20/2018   HCT 22.4 (L) 06/20/2018   MCV 96.1 06/20/2018   PLT 273 06/20/2018   Vital signs stable  Will transfuse 1 u PRBC Recheck labs in AM  Molli HazardMatthew K. Corliss Skainssuei, MD, [facility]FACS [facility]  General/ Trauma Surgery Beeper [phone]  06/20/2018 6:21 PM
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
## 2018-06-20 NOTE — ED Notes (Signed)
Patient resting quietly with family at bedside. °
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
## 2018-06-21 LAB — BASIC METABOLIC PANEL
Anion gap: 11 (ref 5–15)
BUN: 5 mg/dL — ABNORMAL LOW (ref 8–23)
CHLORIDE: 96 mmol/L — AB (ref 98–111)
CO2: 24 mmol/L (ref 22–32)
Calcium: 7.9 mg/dL — ABNORMAL LOW (ref 8.9–10.3)
Creatinine, Ser: 0.57 mg/dL (ref 0.44–1.00)
GFR calc Af Amer: >60 mL/min (ref >60)
GFR calc non Af Amer: >60 mL/min (ref >60)
Glucose, Bld: 108 mg/dL — ABNORMAL HIGH (ref 70–99)
Potassium: 3.9 mmol/L (ref 3.5–5.1)
SODIUM: 131 mmol/L — AB (ref 135–145)

## 2018-06-21 LAB — CBC
HCT: 22.6 % — ABNORMAL LOW (ref 36.0–46.0)
HEMOGLOBIN: 7.4 g/dL — AB (ref 12.0–15.0)
MCH: 34.3 pg — ABNORMAL HIGH (ref 26.0–34.0)
MCHC: 32.7 g/dL (ref 30.0–36.0)
MCV: 104.6 fL — ABNORMAL HIGH (ref 80.0–100.0)
Platelets: 257 10*3/uL (ref 150–400)
RBC: 2.16 MIL/uL — ABNORMAL LOW (ref 3.87–5.11)
RDW: 13 % (ref 11.5–15.5)
WBC: 6.8 10*3/uL (ref 4.0–10.5)
nRBC: 0 % (ref 0.0–0.2)

## 2018-06-21 LAB — URINE CULTURE: Culture: NO GROWTH

## 2018-06-21 MED ORDER — HYDROCODONE-ACETAMINOPHEN 10-325 MG PO TABS
1.0000 | ORAL_TABLET | Freq: Four times a day (QID) | ORAL | Status: DC | PRN
  Administered 2018-06-21 – 2018-06-23 (×8): 1 via ORAL
  Filled 2018-06-21 (×8): qty 1

## 2018-06-21 NOTE — Progress Notes (Signed)
CSW completed SBIRT. CSW noted patient used alcohol about once a week and reported was currently in pain management taking prescribed medications. CSW provided brief psychoeducation on the risks of mixing alcohol with pain medication. CSW encouraged patient follow up with pain management provider for concerns with occasional alcohol use. CSW will continue to follow for PT/OT recommendations.  Patricia DelaineJoey Savan Ruta, LCSW, LCAS-A Clinical Social Worker II [phone]
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
## 2018-06-22 LAB — CBC
HCT: 22 % — ABNORMAL LOW (ref 36.0–46.0)
Hemoglobin: 7 g/dL — ABNORMAL LOW (ref 12.0–15.0)
MCH: 30.4 pg (ref 26.0–34.0)
MCHC: 31.8 g/dL (ref 30.0–36.0)
MCV: 95.7 fL (ref 80.0–100.0)
Platelets: 245 10*3/uL (ref 150–400)
RBC: 2.3 MIL/uL — ABNORMAL LOW (ref 3.87–5.11)
RDW: 15 % (ref 11.5–15.5)
WBC: 7.8 10*3/uL (ref 4.0–10.5)
nRBC: 0 % (ref 0.0–0.2)

## 2018-06-22 LAB — HEMOGLOBIN AND HEMATOCRIT, BLOOD
HCT: 25.5 % — ABNORMAL LOW (ref 36.0–46.0)
Hemoglobin: 8.1 g/dL — ABNORMAL LOW (ref 12.0–15.0)

## 2018-06-22 LAB — PREPARE RBC (CROSSMATCH)

## 2018-06-22 MED ORDER — SODIUM CHLORIDE 0.9% IV SOLUTION
Freq: Once | INTRAVENOUS | Status: AC
  Administered 2018-06-22: 11:00:00 via INTRAVENOUS

## 2018-06-22 MED ORDER — BETHANECHOL CHLORIDE 10 MG PO TABS
10.0000 mg | ORAL_TABLET | Freq: Three times a day (TID) | ORAL | Status: DC
  Administered 2018-06-22 – 2018-06-23 (×6): 10 mg via ORAL
  Filled 2018-06-22 (×7): qty 1
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
## 2018-06-23 LAB — BPAM RBC
Blood Product Expiration Date: 202001032359
Blood Product Expiration Date: 202001082359
ISSUE DATE / TIME: 201912271932
ISSUE DATE / TIME: 201912291046
UNIT TYPE AND RH: 600
Unit Type and Rh: 600

## 2018-06-23 LAB — TYPE AND SCREEN
ABO/RH(D): A NEG
Antibody Screen: NEGATIVE
Unit division: 0
Unit division: 0

## 2018-06-23 LAB — CBC
HCT: 25.2 % — ABNORMAL LOW (ref 36.0–46.0)
HCT: 25.4 % — ABNORMAL LOW (ref 36.0–46.0)
Hemoglobin: 8 g/dL — ABNORMAL LOW (ref 12.0–15.0)
Hemoglobin: 8.4 g/dL — ABNORMAL LOW (ref 12.0–15.0)
MCH: 29.9 pg (ref 26.0–34.0)
MCH: 31.1 pg (ref 26.0–34.0)
MCHC: 31.7 g/dL (ref 30.0–36.0)
MCHC: 33.1 g/dL (ref 30.0–36.0)
MCV: 94 fL (ref 80.0–100.0)
MCV: 94.1 fL (ref 80.0–100.0)
PLATELETS: 150 10*3/uL (ref 150–400)
Platelets: 278 10*3/uL (ref 150–400)
RBC: 2.68 MIL/uL — ABNORMAL LOW (ref 3.87–5.11)
RBC: 2.7 MIL/uL — ABNORMAL LOW (ref 3.87–5.11)
RDW: 14.4 % (ref 11.5–15.5)
RDW: 14.5 % (ref 11.5–15.5)
WBC: 6.9 10*3/uL (ref 4.0–10.5)
WBC: 7.7 10*3/uL (ref 4.0–10.5)
nRBC: 0 % (ref 0.0–0.2)
nRBC: 0 % (ref 0.0–0.2)

## 2018-06-23 MED ORDER — POLYETHYLENE GLYCOL 3350 17 G PO PACK: 17.0000 g | PACK | Freq: Every day | ORAL | Status: DC

## 2018-06-23 MED ORDER — MORPHINE SULFATE ER 15 MG PO TBCR
120.0000 mg | EXTENDED_RELEASE_TABLET | Freq: Every day | ORAL | Status: DC
  Administered 2018-06-23: 120 mg via ORAL
  Filled 2018-06-23: qty 8

## 2018-06-23 MED ORDER — TAB-A-VITE/IRON PO TABS
1.0000 | ORAL_TABLET | Freq: Every day | ORAL | Status: DC
  Administered 2018-06-23 – 2018-06-24 (×2): 1 via ORAL
  Filled 2018-06-23 (×2): qty 1

## 2018-06-23 MED ORDER — HYDROMORPHONE HCL 1 MG/ML IJ SOLN: 0.5000 mg | INTRAMUSCULAR | Status: DC | PRN

## 2018-06-23 MED ORDER — HYDROCODONE-ACETAMINOPHEN 10-325 MG PO TABS
1.0000 | ORAL_TABLET | ORAL | Status: DC | PRN
  Administered 2018-06-23 – 2018-06-24 (×3): 1 via ORAL
  Filled 2018-06-23 (×3): qty 1

## 2018-06-23 MED ORDER — INFLUENZA VAC SPLIT QUAD 0.5 ML IM SUSY
0.5000 mL | PREFILLED_SYRINGE | INTRAMUSCULAR | Status: AC
  Administered 2018-06-24: 0.5 mL via INTRAMUSCULAR
  Filled 2018-06-23: qty 0.5
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
## 2018-06-23 NOTE — Discharge Instructions (Addendum)
Alternate ice and heat to abdominal wall hematoma Continue abdominal binder Continue stool softener/laxative to avoid constipation Call with concerns (ie increased abdominal pain, nausea, vomiting, fever, chills, lightheadedness/dizziness...)   Pain Medicine Instructions You may need pain medicine after an injury or illness. Two common types of pain medicine are:  Opioid pain medicine. These may be called opioids.  Non-opioid pain medicine. This includes NSAIDs. It is important to follow your doctor's instructions when you are taking pain medicine. Doing this can keep yourself and others safe. How can pain medicine affect me? Pain medicine may not make all of your pain go away. It should make you comfortable enough to:  Move.  Breathe.  Do normal activities. Opioids can cause side effects, such as:  Trouble pooping (constipation).  Feeling sick to your stomach (nausea).  Throwing up (vomiting).  Feeling very sleepy.  Confusion.  Taking the medicine for nonmedical reasons even though taking it hurts your health and well-being (opioid use disorder).  Trouble breathing (respiratory depression). Taking opioids for longer than 3 days raises your risk of these side effects. Taking opioids for a long time can affect how well you can do daily tasks. Taking them for a long time also puts you at risk for:  Car crashes.  Depression.  Suicide.  Heart attack.  Taking too much of the medicine (overdose). This can lead to death. What should I do to stay safe while taking pain medicine? Take your medicine as told  Take pain medicine exactly as told by your doctor. Take it only when you need it.  Write down the times when you take your pain medicine. Look at the times before you take your next dose.  Take other over-the-counter or prescription medicines only as told by your doctor. ? If your pain medicine has acetaminophen in it, do not take any other acetaminophen while you  are taking this medicine. Too much can damage the liver.  Get pain medicine prescriptions from only one doctor. Avoid certain activities While you are taking prescription pain medicine, and for 8 hours after your last dose:  Do not drive.  Do not use machinery.  Do not use power tools.  Do not sign legal documents.  Do not drink alcohol.  Do not take sleeping pills.  Do not take care of children by yourself.  Do not do any activities that involve climbing or being in high places.  Do not go into any body of water unless there is an adult nearby who can watch you and help you if needed. This includes: ? Lakes. ? Rivers. ? Oceans. ? Spas. ? Swimming pools.  Keep others safe  Store your medicine as told by your doctor. Keep it where children and pets cannot reach it.  Do not share your pain medicine with anyone.  Do not save any leftover pills. If you have leftover pills, you can: ? Bring them to a take-back program. ? Bring them to a pharmacy that has a drug disposal container. ? Throw them in the trash. Check the medicine label or package insert to see if it is safe to throw it out. If it is safe, take the medicine out of the container. Mix it with something that makes it unusable, such as pet waste. Then put the medicine in the trash. General instructions  Talk with your doctor about other ways to manage your pain.  If you have trouble pooping: ? Drink enough fluid to keep your pee (urine) pale yellow. ?  Use a poop (stool) softener as told by your doctor. ? Eat more fruits and vegetables.  Keep all follow-up visits as told by your doctor. This is important. Contact a doctor if:  Your medicine is not helping with your pain.  You have a rash.  You feel depressed. Get help right away if: Seek medical care right away if you are taking pain medicines and you (or people close to you) notice any of the following:  Trouble breathing.  Breathing that is shorter  than normal.  Breathing that is more shallow than normal.  Confusion.  Sleepiness.  Trouble staying awake.  Feeling sick to your stomach.  Throwing up.  Your skin or lips turning pale or bluish in color.  Tongue swelling. If you ever feel like you may hurt yourself or others, or have thoughts about taking your own life, get help right away. Go to your nearest emergency department or call:  Your local emergency services (911 in the [place]  A suicide crisis helpline, such as the National Suicide Prevention Lifeline at 403-353-82631-3092753065. This is open 24 hours a day. Summary  Take your pain medicine exactly as told by your doctor.  Pain medicine can help lower your pain. It may also cause side effects.  Talk with your doctor about other ways to manage your pain.  Follow your doctor's instructions about how to take your pain medicine and keep others safe. Ask what activities you should avoid while taking pain medicine. This information is not intended to replace advice given to you by your health care provider. Make sure you discuss any questions you have with your health care provider. Document Released: 11/28/2007 Document Revised: 01/21/2017 Document Reviewed: 01/21/2017 Elsevier Interactive Patient Education  2019 [employer]

## 2018-06-24 LAB — BASIC METABOLIC PANEL
Anion gap: 9 (ref 5–15)
BUN: 6 mg/dL — ABNORMAL LOW (ref 8–23)
CHLORIDE: 104 mmol/L (ref 98–111)
CO2: 29 mmol/L (ref 22–32)
CREATININE: 0.5 mg/dL (ref 0.44–1.00)
Calcium: 8.5 mg/dL — ABNORMAL LOW (ref 8.9–10.3)
GFR calc Af Amer: >60 mL/min (ref >60)
GFR calc non Af Amer: >60 mL/min (ref >60)
Glucose, Bld: 109 mg/dL — ABNORMAL HIGH (ref 70–99)
Potassium: 3.2 mmol/L — ABNORMAL LOW (ref 3.5–5.1)
Sodium: 142 mmol/L (ref 135–145)

## 2018-06-24 LAB — CBC
HCT: 25.3 % — ABNORMAL LOW (ref 36.0–46.0)
Hemoglobin: 8.1 g/dL — ABNORMAL LOW (ref 12.0–15.0)
MCH: 29.9 pg (ref 26.0–34.0)
MCHC: 32 g/dL (ref 30.0–36.0)
MCV: 93.4 fL (ref 80.0–100.0)
Platelets: 278 10*3/uL (ref 150–400)
RBC: 2.71 MIL/uL — ABNORMAL LOW (ref 3.87–5.11)
RDW: 14.6 % (ref 11.5–15.5)
WBC: 7.1 10*3/uL (ref 4.0–10.5)
nRBC: 0 % (ref 0.0–0.2)
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
## 2018-06-24 NOTE — Progress Notes (Signed)
Pt being discharged home via wheelchair with family. Pt alert and oriented x4. VSS. Pt c/o no pain at this time. No signs of respiratory distress. Education complete and care plans resolved. IV removed with catheter intact and pt tolerated well. No further issues at this time. Pt to follow up with PCP. Amarie Viles R, RN 
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
## 2018-07-23 ENCOUNTER — Telehealth: Payer: Self-pay | Admitting: General Surgery

## 2018-07-23 NOTE — Telephone Encounter (Signed)
I tried to reach this patient to inform her of her lab results from yesterday.  She did not answer the phone.  I left her a VM to call our office back to get her results.  Her labs were all normal.  We will encourage her to follow up with her PCP in regards to her malaise, dizziness, and intermittent shortness of breath as anemia doesn't seem to be the cause at this time.  She may keep her follow up with [place] in 2 weeks if she would like to, however, if she is feeling better and no further abdominal complaints, her hematoma is stable and she could just call as needed.   Letha Cape 10:57 AM 07/23/2018

## 2019-01-12 ENCOUNTER — Other Ambulatory Visit: Payer: Self-pay | Admitting: *Deleted

## 2019-01-12 DIAGNOSIS — Z20822 Contact with and (suspected) exposure to covid-19: Secondary | ICD-10-CM

## 2019-01-12 NOTE — Addendum Note (Signed)
Addended by: Darletta Noblett M on: 01/12/2019 09:04 PM   Modules accepted: Orders  

## 2019-01-16 LAB — NOVEL CORONAVIRUS, NAA: SARS-CoV-2, NAA: NOT DETECTED

## 2019-02-11 ENCOUNTER — Ambulatory Visit (HOSPITAL_COMMUNITY)
Admission: EM | Admit: 2019-02-11 | Discharge: 2019-02-11 | Disposition: A | Discharge status: Home / Self Care | Payer: BC Managed Care – PPO | Attending: Urgent Care | Admitting: Urgent Care

## 2019-02-11 ENCOUNTER — Encounter (HOSPITAL_COMMUNITY): Payer: Self-pay | Admitting: Urgent Care

## 2019-02-11 ENCOUNTER — Other Ambulatory Visit: Payer: Self-pay

## 2019-02-11 DIAGNOSIS — J029 Acute pharyngitis, unspecified: Secondary | ICD-10-CM | POA: Diagnosis not present

## 2019-02-11 DIAGNOSIS — B9789 Other viral agents as the cause of diseases classified elsewhere: Secondary | ICD-10-CM | POA: Diagnosis present

## 2019-02-11 DIAGNOSIS — Z20822 Contact with and (suspected) exposure to covid-19: Secondary | ICD-10-CM

## 2019-02-11 DIAGNOSIS — I10 Essential (primary) hypertension: Secondary | ICD-10-CM | POA: Insufficient documentation

## 2019-02-11 DIAGNOSIS — R509 Fever, unspecified: Secondary | ICD-10-CM

## 2019-02-11 DIAGNOSIS — Z20828 Contact with and (suspected) exposure to other viral communicable diseases: Secondary | ICD-10-CM | POA: Diagnosis present

## 2019-02-11 DIAGNOSIS — R5381 Other malaise: Secondary | ICD-10-CM

## 2019-02-11 DIAGNOSIS — U071 COVID-19: Secondary | ICD-10-CM | POA: Insufficient documentation

## 2019-02-11 DIAGNOSIS — J069 Acute upper respiratory infection, unspecified: Secondary | ICD-10-CM

## 2019-02-11 LAB — POCT RAPID STREP A: Streptococcus, Group A Screen (Direct): NEGATIVE

## 2019-02-11 NOTE — ED Triage Notes (Signed)
Pt presents to UC with scratchy throat started Saturday and sore today, and c/o fever this morning. Pt reports temp of 101.6 F at home. Took tylenol at home.
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
## 2019-02-11 NOTE — Discharge Instructions (Signed)
We will manage this as a viral syndrome. For sore throat or cough try using a honey-based tea. Use 3 teaspoons of honey with juice squeezed from half lemon. Place shaved pieces of ginger into 1/2-1 cup of water and warm over stove top. Then mix the ingredients and repeat every 4 hours as needed. Please take Tylenol 500mg every 6 hours. Hydrate very well with at least 2 liters of water. Eat light meals such as soups to replenish electrolytes and soft fruits, veggies. Start an antihistamine like Zyrtec, Allegra or Claritin for postnasal drainage, sinus congestion.  You can take this together with pseudoephedrine (Sudafed) at a dose of 60 mg 3 times a day as needed for the same kind of congestion.  However, do not take Sudafed if you have high blood pressure or are prone to palpitations, have abnormal heart rhythms. ° °

## 2019-02-13 ENCOUNTER — Telehealth (HOSPITAL_COMMUNITY): Payer: Self-pay | Admitting: Emergency Medicine

## 2019-02-13 LAB — CULTURE, GROUP A STREP (THRC)

## 2019-02-13 NOTE — Telephone Encounter (Signed)
Your test for COVID-19 was positive, meaning that you were infected with the novel coronavirus and could give the germ to others.  Please continue isolation at home, for at least 10 days since the start of your fever/cough/breathlessness and until you have had 3 consecutive days without fever (without taking a fever reducer) and with cough/breathlessness improving. Please continue good preventive care measures, including:  frequent hand-washing, avoid touching your face, cover coughs/sneezes, stay out of crowds and keep a 6 foot distance from others.  Recheck or go to the nearest hospital ED tent for re-assessment if fever/cough/breathlessness return.  Patient contacted and made aware of    results, all questions answered   

## 2019-02-16 LAB — NOVEL CORONAVIRUS, NAA (HOSP ORDER, SEND-OUT TO REF LAB; TAT 18-24 HRS): SARS-CoV-2, NAA: DETECTED — AB
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
## 2020-11-05 ENCOUNTER — Observation Stay (HOSPITAL_COMMUNITY)
Admission: EM | Admit: 2020-11-05 | Discharge: 2020-11-06 | Disposition: A | Discharge status: Home / Self Care | Payer: BC Managed Care – PPO | Attending: Internal Medicine | Admitting: Internal Medicine

## 2020-11-05 ENCOUNTER — Emergency Department (HOSPITAL_COMMUNITY): Payer: BC Managed Care – PPO

## 2020-11-05 ENCOUNTER — Observation Stay (HOSPITAL_COMMUNITY): Payer: BC Managed Care – PPO

## 2020-11-05 ENCOUNTER — Other Ambulatory Visit: Payer: Self-pay

## 2020-11-05 DIAGNOSIS — Z79899 Other long term (current) drug therapy: Secondary | ICD-10-CM | POA: Diagnosis not present

## 2020-11-05 DIAGNOSIS — R6510 Systemic inflammatory response syndrome (SIRS) of non-infectious origin without acute organ dysfunction: Secondary | ICD-10-CM | POA: Diagnosis not present

## 2020-11-05 DIAGNOSIS — I10 Essential (primary) hypertension: Secondary | ICD-10-CM | POA: Diagnosis not present

## 2020-11-05 DIAGNOSIS — N201 Calculus of ureter: Secondary | ICD-10-CM

## 2020-11-05 DIAGNOSIS — E876 Hypokalemia: Secondary | ICD-10-CM | POA: Diagnosis not present

## 2020-11-05 DIAGNOSIS — E785 Hyperlipidemia, unspecified: Secondary | ICD-10-CM

## 2020-11-05 DIAGNOSIS — Z8616 Personal history of COVID-19: Secondary | ICD-10-CM | POA: Diagnosis not present

## 2020-11-05 DIAGNOSIS — S30861A Insect bite (nonvenomous) of abdominal wall, initial encounter: Secondary | ICD-10-CM | POA: Diagnosis not present

## 2020-11-05 DIAGNOSIS — N179 Acute kidney failure, unspecified: Secondary | ICD-10-CM | POA: Diagnosis not present

## 2020-11-05 DIAGNOSIS — I152 Hypertension secondary to endocrine disorders: Secondary | ICD-10-CM | POA: Diagnosis present

## 2020-11-05 DIAGNOSIS — R7401 Elevation of levels of liver transaminase levels: Secondary | ICD-10-CM | POA: Insufficient documentation

## 2020-11-05 DIAGNOSIS — W57XXXA Bitten or stung by nonvenomous insect and other nonvenomous arthropods, initial encounter: Secondary | ICD-10-CM | POA: Diagnosis not present

## 2020-11-05 DIAGNOSIS — N132 Hydronephrosis with renal and ureteral calculous obstruction: Principal | ICD-10-CM | POA: Insufficient documentation

## 2020-11-05 DIAGNOSIS — Z20822 Contact with and (suspected) exposure to covid-19: Secondary | ICD-10-CM | POA: Insufficient documentation

## 2020-11-05 DIAGNOSIS — R7989 Other specified abnormal findings of blood chemistry: Secondary | ICD-10-CM | POA: Diagnosis not present

## 2020-11-05 DIAGNOSIS — R509 Fever, unspecified: Secondary | ICD-10-CM | POA: Diagnosis present

## 2020-11-05 DIAGNOSIS — R52 Pain, unspecified: Secondary | ICD-10-CM

## 2020-11-05 LAB — LACTIC ACID, PLASMA
Lactic Acid, Venous: 1.5 mmol/L (ref 0.5–1.9)
Lactic Acid, Venous: 1.1 mmol/L (ref 0.5–1.9)

## 2020-11-05 LAB — CBC
HCT: 35.1 % — ABNORMAL LOW (ref 36.0–46.0)
Hemoglobin: 11.3 g/dL — ABNORMAL LOW (ref 12.0–15.0)
MCH: 30.3 pg (ref 26.0–34.0)
MCHC: 32.2 g/dL (ref 30.0–36.0)
MCV: 94.1 fL (ref 80.0–100.0)
Platelets: 240 10*3/uL (ref 150–400)
RBC: 3.73 MIL/uL — ABNORMAL LOW (ref 3.87–5.11)
RDW: 12.8 % (ref 11.5–15.5)
WBC: 7.4 10*3/uL (ref 4.0–10.5)
nRBC: 0 % (ref 0.0–0.2)

## 2020-11-05 LAB — URINALYSIS, ROUTINE W REFLEX MICROSCOPIC
Bacteria, UA: NONE SEEN
Bilirubin Urine: NEGATIVE
Glucose, UA: NEGATIVE mg/dL
Ketones, ur: NEGATIVE mg/dL
Nitrite: NEGATIVE
Protein, ur: 30 mg/dL — AB
Specific Gravity, Urine: 1.021 (ref 1.005–1.030)
pH: 5 (ref 5.0–8.0)

## 2020-11-05 LAB — COMPREHENSIVE METABOLIC PANEL
ALT: 71 U/L — ABNORMAL HIGH (ref 0–44)
AST: 55 U/L — ABNORMAL HIGH (ref 15–41)
Albumin: 3.1 g/dL — ABNORMAL LOW (ref 3.5–5.0)
Alkaline Phosphatase: 131 U/L — ABNORMAL HIGH (ref 38–126)
Anion gap: 12 (ref 5–15)
BUN: 21 mg/dL (ref 8–23)
CO2: 28 mmol/L (ref 22–32)
Calcium: 9 mg/dL (ref 8.9–10.3)
Chloride: 93 mmol/L — ABNORMAL LOW (ref 98–111)
Creatinine, Ser: 1.09 mg/dL — ABNORMAL HIGH (ref 0.44–1.00)
GFR, Estimated: 57 mL/min — ABNORMAL LOW (ref >60)
Glucose, Bld: 166 mg/dL — ABNORMAL HIGH (ref 70–99)
Potassium: 3.2 mmol/L — ABNORMAL LOW (ref 3.5–5.1)
Sodium: 133 mmol/L — ABNORMAL LOW (ref 135–145)
Total Bilirubin: 0.5 mg/dL (ref 0.3–1.2)
Total Protein: 7.4 g/dL (ref 6.5–8.1)

## 2020-11-05 LAB — RESP PANEL BY RT-PCR (FLU A&B, COVID) ARPGX2
Influenza A by PCR: NEGATIVE
Influenza B by PCR: NEGATIVE
SARS Coronavirus 2 by RT PCR: NEGATIVE

## 2020-11-05 LAB — SODIUM, URINE, RANDOM: Sodium, Ur: 25 mmol/L

## 2020-11-05 LAB — CBG MONITORING, ED: Glucose-Capillary: 160 mg/dL — ABNORMAL HIGH (ref 70–99)

## 2020-11-05 LAB — CREATININE, URINE, RANDOM: Creatinine, Urine: 153.59 mg/dL

## 2020-11-05 LAB — OSMOLALITY, URINE: Osmolality, Ur: 614 mOsm/kg (ref 300–900)

## 2020-11-05 MED ORDER — DULOXETINE HCL 60 MG PO CPEP
90.0000 mg | ORAL_CAPSULE | Freq: Every day | ORAL | Status: DC
  Administered 2020-11-06: 90 mg via ORAL
  Filled 2020-11-05: qty 1

## 2020-11-05 MED ORDER — ATORVASTATIN CALCIUM 40 MG PO TABS
40.0000 mg | ORAL_TABLET | Freq: Every day | ORAL | Status: DC
  Administered 2020-11-05: 40 mg via ORAL
  Filled 2020-11-05: qty 1

## 2020-11-05 MED ORDER — SODIUM CHLORIDE 0.9 % IV SOLN
100.0000 mg | Freq: Once | INTRAVENOUS | Status: AC
  Administered 2020-11-05: 100 mg via INTRAVENOUS
  Filled 2020-11-05: qty 100

## 2020-11-05 MED ORDER — SODIUM CHLORIDE 0.9 % IV SOLN: INTRAVENOUS | Status: DC

## 2020-11-05 MED ORDER — ACETAMINOPHEN 650 MG RE SUPP: 650.0000 mg | Freq: Four times a day (QID) | RECTAL | Status: DC | PRN

## 2020-11-05 MED ORDER — METHOCARBAMOL 500 MG PO TABS: 500.0000 mg | ORAL_TABLET | Freq: Two times a day (BID) | ORAL | Status: DC | PRN

## 2020-11-05 MED ORDER — ACETAMINOPHEN 325 MG PO TABS: 650.0000 mg | ORAL_TABLET | Freq: Four times a day (QID) | ORAL | Status: DC | PRN

## 2020-11-05 MED ORDER — SODIUM CHLORIDE 0.9 % IV SOLN
1.0000 g | Freq: Once | INTRAVENOUS | Status: AC
  Administered 2020-11-06: 1 g via INTRAVENOUS
  Filled 2020-11-05: qty 10

## 2020-11-05 MED ORDER — POTASSIUM CHLORIDE 10 MEQ/100ML IV SOLN
10.0000 meq | INTRAVENOUS | Status: AC
  Administered 2020-11-05 – 2020-11-06 (×2): 10 meq via INTRAVENOUS
  Filled 2020-11-05 (×2): qty 100

## 2020-11-05 MED ORDER — HYDROCODONE-ACETAMINOPHEN 5-325 MG PO TABS
1.0000 | ORAL_TABLET | ORAL | Status: DC | PRN
  Administered 2020-11-06: 2 via ORAL
  Filled 2020-11-05: qty 2

## 2020-11-05 NOTE — ED Notes (Signed)
Patient transported to CT 

## 2020-11-05 NOTE — ED Notes (Signed)
Lab called to add on urine 

## 2020-11-05 NOTE — H&P (Incomplete)
Patricia Davidson NUU:[id number] DOB: January 01, 1956 DOA: 11/05/2020    PCP: Patient, No Pcp Per (Inactive)   Outpatient Specialists:  NONE   Patient arrived to ER on 11/05/20 at 1542 Referred by Attending Horton, Clabe Seal, DO   Patient coming from: home Lives alone,        Chief Complaint:   Chief Complaint  Patient presents with  . Fever    HPI: Imberly Troxler is a 65 y.o. female with medical history significant of chronic pain     Presented with   Fever up to 104 Recently diagnosed with UTI/pyelonephritis and started on Cipro.  Was also found a tick on herself and started on doxycycline with IM injection.  She continued a significant fever and became somewhat confused. She was unable to tell her daughter where she was today Family reports negative COVID test this week x3 Patient reports mild twinge of pain on the left abdomen she is on chronic pain medications  Been using ibuprofen and Tylenol to try to control her fever Otherwise no runny nose no sore throat no nausea vomiting or diarrhea she is somewhat constipated  Infectious risk factors:  Reports  Fever,     Has  been vaccinated against COVID and boosted   Initial COVID TEST  NEGATIVE   Lab Results  Component Value Date   SARSCOV2NAA NEGATIVE 11/05/2020   SARSCOV2NAA DETECTED (A) 02/11/2019   SARSCOV2NAA Not Detected 01/12/2019     Regarding pertinent Chronic problems: ***  ****Hyperlipidemia - *on statins Lipid Panel  No results found for: CHOL, TRIG, HDL, CHOLHDL, VLDL, LDLCALC, LDLDIRECT, LABVLDL  ***HTN on   ***chronic CHF diastolic/systolic/ combined - last echo***  *** CAD  - On Aspirin, statin, betablocker, Plavix                 - *followed by cardiology                - last cardiac cath  The ASCVD Risk score Denman George DC Jr., et al., 2013) failed to calculate for the following reasons:   Cannot find a previous HDL lab   Cannot find a previous total cholesterol lab    ***DM 2 - No  results found for: HGBA1C ****on insulin, PO meds only, diet controlled  ***Hypothyroidism: No results found for: TSH on synthroid  *** Morbid obesity-   BMI Readings from Last 1 Encounters:  06/19/18 34.58 kg/m     *** Asthma -well *** controlled on home inhalers/ nebs f                        ***last no prior***admission  ***                       No ***history of intubation  *** COPD - not **followed by pulmonology *** not  on baseline oxygen  *L,    *** OSA -on nocturnal oxygen, *CPAP, *noncompliant with CPAP  *** Hx of CVA - *with/out residual deficits on Aspirin 81 mg, 325, Plavix  ***A. Fib -  - CHA2DS2 vas score **** CHA2DS2/VAS Stroke Risk Points      N/A >= 2 Points: High Risk  1 - 1.99 Points: Medium Risk  0 Points: Low Risk    Last Change: N/A      This score determines the patient's risk of having a stroke if the  patient has atrial fibrillation.      This score is  not applicable to this patient. Components are not  calculated.     current  on anticoagulation with ****Coumadin  ***Xarelto,* Eliquis,  *** Not on anticoagulation secondary to Risk of Falls, *** recurrent bleeding         -  Rate control:  Currently controlled with ***Toprolol,  *Metoprolol,* Diltiazem, *Coreg          - Rhythm control: *** amiodarone, *flecainide  ***Hx of DVT/PE on - anticoagulation with ****Coumadin  ***Xarelto,* Eliquis,   ***CKD stage III*- baseline Cr **** CrCl cannot be calculated (Unknown ideal weight.).  Lab Results  Component Value Date   CREATININE 1.09 (H) 11/05/2020   CREATININE 0.50 06/24/2018   CREATININE 0.57 06/21/2018     **** Liver disease Computed MELD-Na score unavailable. Necessary lab results were not found in the last year. Computed MELD score unavailable. Necessary lab results were not found in the last year.   ***BPH - on Flomax, Proscar    *** Dementia - on Aricept** Nemenda  *** Chronic anemia - baseline hg Hemoglobin & Hematocrit  Recent  Labs    11/05/20 1645  HGB 11.3*   While in ER: Found to have elevated LFTs and AKI Normal lactic acid UA appeared to be unremarkable cultures obtained chest x-ray was also unremarkable given elevated LFTs hepatitis panel was ordered she was given doxycycline   ED Triage Vitals  Enc Vitals Group     BP 11/05/20 1550 (!) 158/95     Pulse Rate 11/05/20 1550 87     Resp 11/05/20 1550 16     Temp 11/05/20 1550 99.2 F (37.3 C)     Temp src --      SpO2 11/05/20 1550 98 %     Weight --      Height --      Head Circumference --      Peak Flow --      Pain Score 11/05/20 1549 0     Pain Loc --      Pain Edu? --      Excl. in GC? --   TMAX(24)@     _________________________________________ Significant initial  Findings: Abnormal Labs Reviewed  COMPREHENSIVE METABOLIC PANEL - Abnormal; Notable for the following components:      Result Value   Sodium 133 (*)    Potassium 3.2 (*)    Chloride 93 (*)    Glucose, Bld 166 (*)    Creatinine, Ser 1.09 (*)    Albumin 3.1 (*)    AST 55 (*)    ALT 71 (*)    Alkaline Phosphatase 131 (*)    GFR, Estimated 57 (*)    All other components within normal limits  CBC - Abnormal; Notable for the following components:   RBC 3.73 (*)    Hemoglobin 11.3 (*)    HCT 35.1 (*)    All other components within normal limits  URINALYSIS, ROUTINE W REFLEX MICROSCOPIC - Abnormal; Notable for the following components:   Hgb urine dipstick MODERATE (*)    Protein, ur 30 (*)    Leukocytes,Ua TRACE (*)    All other components within normal limits  CBG MONITORING, ED - Abnormal; Notable for the following components:   Glucose-Capillary 160 (*)    All other components within normal limits   ____________________________________________ Ordered   CXR - NON acute     ECG: Ordered Personally reviewed by me showing: HR : *** Rhythm: *NSR, Sinus tachycardia * A.fib. W RVR, RBBB, LBBB,  Paced Ischemic changes*nonspecific changes, no evidence of ischemic  changes QTC* ____________________ This patient meets SIRS Criteria and may be septic. SIRS = Systemic Inflammatory Response Syndrome  Order a lactic acid level if needed AND/OR Initiate the sepsis protocol with the attached order set OR Click "Treating Associated Infection or Illness" if the patient is being treated for an infection that is a known cause of these abnormalities     The recent clinical data is shown below. Vitals:   11/05/20 1810 11/05/20 1830 11/05/20 1930 11/05/20 2000  BP: 132/75 118/77 (!) 135/119 138/69  Pulse: 67 70 82 60  Resp:  18 17 16   Temp:      SpO2: 100% 97% 98% 92%        WBC     Component Value Date/Time   WBC 7.4 11/05/2020 1645   LYMPHSABS 2.0 06/19/2018 2058   MONOABS 0.7 06/19/2018 2058   EOSABS 0.1 06/19/2018 2058   BASOSABS 0.0 06/19/2018 2058        Lactic Acid, Venous    Component Value Date/Time   LATICACIDVEN 1.5 11/05/2020 1702     Procalcitonin *** Ordered Lactic Acid, Venous    Component Value Date/Time   LATICACIDVEN 1.5 11/05/2020 1702     Procalcitonin *** Ordered   UA *** no evidence of UTI  ***Pending ***not ordered   Urine analysis:    Component Value Date/Time   COLORURINE YELLOW 11/05/2020 1702   APPEARANCEUR CLEAR 11/05/2020 1702   LABSPEC 1.021 11/05/2020 1702   PHURINE 5.0 11/05/2020 1702   GLUCOSEU NEGATIVE 11/05/2020 1702   HGBUR MODERATE (A) 11/05/2020 1702   BILIRUBINUR NEGATIVE 11/05/2020 1702   KETONESUR NEGATIVE 11/05/2020 1702   PROTEINUR 30 (A) 11/05/2020 1702   NITRITE NEGATIVE 11/05/2020 1702   LEUKOCYTESUR TRACE (A) 11/05/2020 1702    Results for orders placed or performed during the hospital encounter of 02/11/19  Novel Coronavirus, NAA (hospital order; send-out to ref lab)     Status: Abnormal   Collection Time: 02/11/19 12:33 PM   Specimen: Nasopharyngeal Swab; Respiratory  Result Value Ref Range Status   SARS-CoV-2, NAA DETECTED (A) NOT DETECTED Final    Comment:  CRITICAL RESULT CALLED TO, READ BACK BY AND VERIFIED WITH: C. CONNOR, RN ([facility]) VIA EMAIL AT 1922 ON 02/13/19 BY C. JESSUP, MT. (NOTE)                  Client Requested Flag This test was developed and its performance characteristics determined by 02/15/19. This test has not been FDA cleared or approved. This test has been authorized by FDA under an Emergency Use Authorization (EUA). This test is only authorized for the duration of time the declaration that circumstances exist justifying the authorization of the emergency use of in vitro diagnostic tests for detection of SARS-CoV-2 virus and/or diagnosis of COVID-19 infection under section 564(b)(1) of the Act, 21 U.S.C. [employer]), unless the authorization is terminated or revoked sooner. When diagnostic testing is negative, the possibility of a false negative result should be considered in the context of a patient's recent exposures and the presence of clinical signs and symptoms consistent with COVID- 19. An individual without symptoms of COVID-19 and who is not shedding SARS-CoV-2 virus would expect to have a negative (not detected) result in this assay. Performed At: [facility] [address] [place], [place] [id number] MD Jolene Schimke    Coronavirus Source NASOPHARYNGEAL  Final    Comment: Performed at [facility],  [address], [place], [place] [address]  Culture, group A strep     Status: None   Collection Time: 02/11/19 12:35 PM   Specimen: Throat  Result Value Ref Range Status   Specimen Description THROAT  Final   Special Requests NONE  Final   Culture   Final    NO GROUP A STREP (S.PYOGENES) ISOLATED Performed at [facility], [address], [place], [place] [address]    Report Status 02/13/2019 FINAL  Final     _______________________________________________________ ER Provider Called:    Infectious disease Dr. Daiva Eves They Recommend admit to medicine   Will see in AM    _______________________________________________ Hospitalist was called for admission for SIRS and tick borne illness  The following Work up has been ordered so far:  Orders Placed This Encounter  Procedures  . Urine culture  . Culture, blood (routine x 2)  . DG Chest Port 1 View  . Comprehensive metabolic panel  . CBC  . Urinalysis, Routine w reflex microscopic Urine, Clean Catch  . Lactic acid, plasma  . Hepatitis panel, acute  . HIV Antibody (routine testing w rflx)  . Diet NPO time specified  . Document Height and Actual Weight  . Consult to infectious diseases  . Consult for [facility] Admission  . CBG monitoring, ED    Following Medications were ordered in ER: Medications  doxycycline (VIBRAMYCIN) 100 mg in sodium chloride 0.9 % 250 mL IVPB (100 mg Intravenous New Bag/Given 11/05/20 2045)    OTHER Significant initial  Findings:  labs showing:    Recent Labs  Lab 11/05/20 1645  NA 133*  K 3.2*  CO2 28  GLUCOSE 166*  BUN 21  CREATININE 1.09*  CALCIUM 9.0    Cr   Up from baseline see below Lab Results  Component Value Date   CREATININE 1.09 (H) 11/05/2020   CREATININE 0.50 06/24/2018   CREATININE 0.57 06/21/2018    Recent Labs  Lab 11/05/20 1645  AST 55*  ALT 71*  ALKPHOS 131*  BILITOT 0.5  PROT 7.4  ALBUMIN 3.1*   Lab Results  Component Value Date   CALCIUM 9.0 11/05/2020       Plt: Lab Results  Component Value Date   PLT 240 11/05/2020    COVID-19 Labs  No results for input(s): DDIMER, FERRITIN, LDH, CRP in the last 72 hours.  Lab Results  Component Value Date   SARSCOV2NAA DETECTED (A) 02/11/2019   SARSCOV2NAA Not Detected 01/12/2019       Recent Labs  Lab 11/05/20 1645  WBC 7.4  HGB 11.3*  HCT 35.1*  MCV 94.1  PLT 240    HG/HCT stable     Component Value Date/Time   HGB 11.3 (L) 11/05/2020 1645   HCT 35.1 (L) 11/05/2020 1645   MCV 94.1 11/05/2020 1645     No results for input(s):  LIPASE, AMYLASE in the last 168 hours. No results for input(s): AMMONIA in the last 168 hours.   Cardiac Panel (last 3 results) No results for input(s): CKTOTAL, CKMB, TROPONINI, RELINDX in the last 72 hours.   BNP (last 3 results) No results for input(s): BNP in the last 8760 hours.    DM  labs:  HbA1C: No results for input(s): HGBA1C in the last 8760 hours.     CBG (last 3)  Recent Labs    11/05/20 1554  GLUCAP 160*     Cultures:    Component Value Date/Time   SDES  THROAT 02/11/2019 1235   SPECREQUEST NONE 02/11/2019 1235   CULT  02/11/2019 1235    NO GROUP A STREP (S.PYOGENES) ISOLATED Performed at [facility], [address] [place], [place] [address]    REPTSTATUS 02/13/2019 FINAL 02/11/2019 1235     Radiological Exams on Admission: DG Chest Port 1 View  Result Date: 11/05/2020 CLINICAL DATA:  Fever of unknown source. EXAM: PORTABLE CHEST 1 VIEW COMPARISON:  Radiograph 06/19/2018 FINDINGS: The cardiomediastinal contours are normal. The lungs are clear. Pulmonary vasculature is normal. No consolidation, pleural effusion, or pneumothorax. No acute osseous abnormalities are seen. Overlying artifact may be related to clothing. IMPRESSION: No acute chest findings or explanation for fever. Electronically Signed   By: Narda Rutherford M.D.   On: 11/05/2020 19:33   _______________________________________________________________________________________________________ Latest  Blood pressure 138/69, pulse 60, temperature 99.2 F (37.3 C), resp. rate 16, SpO2 92 %.   Review of Systems:    Pertinent positives include: ***  Constitutional:  No weight loss, night sweats, Fevers, chills, fatigue, weight loss  HEENT:  No headaches, Difficulty swallowing,Tooth/dental problems,Sore throat,  No sneezing, itching, ear ache, nasal congestion, post nasal drip,  Cardio-vascular:  No chest pain, Orthopnea, PND, anasarca, dizziness, palpitations.no Bilateral lower extremity  swelling  GI:  No heartburn, indigestion, abdominal pain, nausea, vomiting, diarrhea, change in bowel habits, loss of appetite, melena, blood in stool, hematemesis Resp:  no shortness of breath at rest. No dyspnea on exertion, No excess mucus, no productive cough, No non-productive cough, No coughing up of blood.No change in color of mucus.No wheezing. Skin:  no rash or lesions. No jaundice GU:  no dysuria, change in color of urine, no urgency or frequency. No straining to urinate.  No flank pain.  Musculoskeletal:  No joint pain or no joint swelling. No decreased range of motion. No back pain.  Psych:  No change in mood or affect. No depression or anxiety. No memory loss.  Neuro: no localizing neurological complaints, no tingling, no weakness, no double vision, no gait abnormality, no slurred speech, no confusion  All systems reviewed and apart from HOPI all are negative _______________________________________________________________________________________________ Past Medical History:   Past Medical History:  Diagnosis Date  . Anemia   . Arthritis   . Depression   . Distal radius fracture, right    intra-articular  . Headache   . Hypercholesterolemia   . Hypertension       Past Surgical History:  Procedure Laterality Date  . ABDOMINAL HYSTERECTOMY    . ADENOIDECTOMY    . APPENDECTOMY    . COLONOSCOPY    . DILATION AND CURETTAGE OF UTERUS    . KNEE ARTHROPLASTY    . OPEN REDUCTION INTERNAL FIXATION (ORIF) DISTAL RADIAL FRACTURE Right 08/18/2016   Procedure: RIGHT DISTAL RADIUS OPEN REDUCTION INTERNAL FIXATION AND REPAIR AS INDICATED;  Surgeon: Bradly Bienenstock, MD;  Location: [facility] OR;  Service: Orthopedics;  Laterality: Right;  . TONSILLECTOMY    . TUBAL LIGATION      Social History:  Ambulatory *** independently cane, walker  wheelchair bound, bed bound     reports that she has never smoked. She has never used smokeless tobacco. She reports that she does not drink  alcohol and does not use drugs.     Family History: *** Family History  Problem Relation Age of Onset  . Heart disease Mother   . Heart disease Father   . Stroke Father    ______________________________________________________________________________________________ Allergies: Allergies  Allergen Reactions  .  Sulfa Antibiotics Other (See Comments)    Childhood allergy (unsure of reaction)  . Sulfamethoxazole Other (See Comments)    Unknown reaction as child     Prior to Admission medications   Medication Sig Start Date End Date Taking? Authorizing Provider  amphetamine-dextroamphetamine (ADDERALL XR) 30 MG 24 hr capsule Take 30 mg by mouth every morning.    [provider]  amphetamine-dextroamphetamine (ADDERALL) 10 MG tablet Take 10 mg by mouth 2 (two) times daily as needed (as directed).     [provider]  atenolol (TENORMIN) 50 MG tablet Take 50 mg by mouth at bedtime.     [provider]  atorvastatin (LIPITOR) 40 MG tablet Take 40 mg by mouth at bedtime.    [provider]  calcium-vitamin D (OSCAL WITH D) 500-200 MG-UNIT tablet Take 1 tablet by mouth at bedtime.    [provider]  DULoxetine (CYMBALTA) 30 MG capsule Take 90 mg by mouth at bedtime.    [provider]  hydrochlorothiazide (MICROZIDE) 12.5 MG capsule Take 12.5 mg by mouth at bedtime.     [provider]  HYDROcodone-acetaminophen (NORCO) 10-325 MG tablet Take 1 tablet by mouth every 6 (six) hours as needed (for breakthrough pain).    [provider]  methocarbamol (ROBAXIN) 500 MG tablet Take 1 tablet (500 mg total) by mouth 2 (two) times daily as needed for muscle spasms. 05/31/18   Eber Hong, MD  morphine (MS CONTIN) 60 MG 12 hr tablet Take 120 mg by mouth at bedtime.    [provider]    ___________________________________________________________________________________________________ Physical Exam: Vitals with BMI  11/05/2020 11/05/2020 11/05/2020  Height - - -  Weight - - -  BMI - - -  Systolic 138 135 098  Diastolic 69 119 77  Pulse 60 82 70     1. General:  in No ***Acute distress***increased work of breathing ***complaining of severe pain****agitated * Chronically ill *well *cachectic *toxic acutely ill -appearing 2. Psychological: Alert and *** Oriented 3. Head/ENT:   Moist *** Dry Mucous Membranes                          Head Non traumatic, neck supple                          Normal *** Poor Dentition 4. SKIN: normal *** decreased Skin turgor,  Skin clean Dry and intact no rash 5. Heart: Regular rate and rhythm no*** Murmur, no Rub or gallop 6. Lungs: ***Clear to auscultation bilaterally, no wheezes or crackles   7. Abdomen: Soft, ***non-tender, Non distended *** obese ***bowel sounds present 8. Lower extremities: no clubbing, cyanosis, no ***edema 9. Neurologically Grossly intact, moving all 4 extremities equally *** strength 5 out of 5 in all 4 extremities cranial nerves II through XII intact 10. MSK: Normal range of motion    Chart has been reviewed  ______________________________________________________________________________________________  Assessment/Plan  ***  Admitted for ***  Present on Admission: **None**   Other plan as per orders.  DVT prophylaxis:  SCD *** Lovenox       Code Status:    Code Status: Prior FULL CODE *** DNR/DNI ***comfort care as per patient ***family  I had personally discussed CODE STATUS with patient and family* I had spent *min discussing goals of care and CODE STATUS    Family Communication:   Family not at  Bedside  plan of  care was discussed on the phone with *** Son, Daughter, Wife, Husband, Sister, Brother , father, mother  Disposition Plan:   *** likely will need placement for rehabilitation                          Back to current facility when stable                            To home once workup is complete and patient is  stable  ***Following barriers for discharge:                            Electrolytes corrected                               Anemia corrected                             Pain controlled with PO medications                               Afebrile, white count improving able to transition to PO antibiotics                             Will need to be able to tolerate PO                            Will likely need home health, home O2, set up                           Will need consultants to evaluate patient prior to discharge  ****EXPECT DC tomorrow                    ***Would benefit from PT/OT eval prior to DC  Ordered                   Swallow eval - SLP ordered                   Diabetes care coordinator                   Transition of care consulted                   Nutrition    consulted                  Wound care  consulted                   Palliative care    consulted                   Behavioral health  consulted                    Consults called: ***    Admission status:  ED Disposition    None       Obs***  ***  inpatient     I Expect 2 midnight stay secondary to severity of patient's current illness need for inpatient interventions justified by  the following: ***hemodynamic instability despite optimal treatment (tachycardia *hypotension * tachypnea *hypoxia, hypercapnia) * Severe lab/radiological/exam abnormalities including:     and extensive comorbidities including: *substance abuse  *Chronic pain *DM2  * CHF * CAD  * COPD/asthma *Morbid Obesity * CKD *dementia *liver disease *history of stroke with residual deficits *  malignancy, * sickle cell disease  . History of amputation . Chronic anticoagulation  That are currently affecting medical management.   I expect  patient to be hospitalized for 2 midnights requiring inpatient medical care.  Patient is at high risk for adverse outcome (such as loss of life or disability) if not  treated.  Indication for inpatient stay as follows:  Severe change from baseline regarding mental status Hemodynamic instability despite maximal medical therapy,  ongoing suicidal ideations,  severe pain requiring acute inpatient management,  inability to maintain oral hydration   persistent chest pain despite medical management Need for operative/procedural  intervention New or worsening hypoxia  Need for IV antibiotics, IV fluids, IV rate controling medications, IV antihypertensives, IV pain medications, IV anticoagulation    Level of care   *** tele  For 12H 24H     medical floor       SDU tele indefinitely please discontinue once patient no longer qualifies COVID-19 Labs    Lab Results  Component Value Date   SARSCOV2NAA DETECTED (A) 02/11/2019     Precautions: admitted as *** Covid Negative  ***asymptomatic screening protocol****PUI *** covid positive No active isolations ***If Covid PCR is negative  - please DC precautions - would need additional investigation given very high risk for false native test result   PPE: Used by the provider:   N95***P100  eye Goggles,  Gloves ***gown     Critical***  Patient is critically ill due to  hemodynamic instability * respiratory failure *severe sepsis* ongoing chest pain*  They are at high risk for life/limb threatening clinical deterioration requiring frequent reassessment and modifications of care.  Services provided include examination of the patient, review of relevant ancillary tests, prescription of lifesaving therapies, review of medications and prophylactic therapy.  Total critical care time excluding separately billable procedures: 60*  Minutes.    Drevon Plog 11/05/2020, 9:07 PM ***  [facility]     after 2 AM please page floor coverage PA If 7AM-7PM, please contact the day team taking care of the patient using [email or web address]   Patient was evaluated in the context of the global COVID-19 pandemic,  which necessitated consideration that the patient might be at risk for infection with the SARS-CoV-2 virus that causes COVID-19. Institutional protocols and algorithms that pertain to the evaluation of patients at risk for COVID-19 are in a state of rapid change based on information released by regulatory bodies including the CDC and federal and state organizations. These policies and algorithms were followed during the patient's care.

## 2020-11-05 NOTE — ED Provider Notes (Signed)
[facility] EMERGENCY DEPARTMENT Provider Note   CSN: [id number] Arrival date & time: 11/05/20  1542     History Chief Complaint  Patient presents with  . Fever    Patricia Davidson is a 65 y.o. female with PMHx HTN, HLD, anemia, presenting for evaluation of fever since Monday. She states fever has been coming and going for a week.  On Thursday she went to urgent care for evaluation.  She was started on ciprofloxacin for some mild abnormalities in her urine dipstick.  States that evening she went out and found a tick on her left hip. It was not engorged.  She went back to urgent care the following day who started her on doxycycline.  States fevers have persisted.  This morning she had taken a nap and was awoken by a phone call.  It took her a few minutes to orient herself on the phone, and this concerned her.  She however is back to her mental baseline has remained at her mental baseline since that time.  Her temperature was over 102 F just prior to arrival therefore she came in for evaluation.  She treated with over-the-counter medication prior to arrival.  Denies any rash, headache, neck pain or stiffness, chest pain, abdominal pain, nausea, vomiting, dysuria, pelvic complaints, URI symptoms.  No known COVID exposures.  States she was tested 3 times for COVID this week and were negative.  The history is provided by the patient and medical records.       Past Medical History:  Diagnosis Date  . Anemia   . Arthritis   . Depression   . Distal radius fracture, right    intra-articular  . Headache   . Hypercholesterolemia   . Hypertension     Patient Active Problem List   Diagnosis Date Noted  . SIRS (systemic inflammatory response syndrome) (HCC) 11/05/2020  . Traumatic hematoma of abdominal wall 06/19/2018  . 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    Past Surgical History:  Procedure Laterality Date  . ABDOMINAL HYSTERECTOMY     . ADENOIDECTOMY    . APPENDECTOMY    . COLONOSCOPY    . DILATION AND CURETTAGE OF UTERUS    . KNEE ARTHROPLASTY    . OPEN REDUCTION INTERNAL FIXATION (ORIF) DISTAL RADIAL FRACTURE Right 08/18/2016   Procedure: RIGHT DISTAL RADIUS OPEN REDUCTION INTERNAL FIXATION AND REPAIR AS INDICATED;  Surgeon: Bradly Bienenstock, MD;  Location: [facility] OR;  Service: Orthopedics;  Laterality: Right;  . TONSILLECTOMY    . TUBAL LIGATION       OB History   No obstetric history on file.     Family History  Problem Relation Age of Onset  . Heart disease Mother   . Heart disease Father   . Stroke Father     Social History   Tobacco Use  . Smoking status: Never Smoker  . Smokeless tobacco: Never Used  Substance Use Topics  . Alcohol use: No  . Drug use: No    Home Medications Prior to Admission medications   Medication Sig Start Date End Date Taking? Authorizing Provider  amphetamine-dextroamphetamine (ADDERALL) 15 MG tablet Take 15 mg by mouth daily as needed.   Yes [provider]  aspirin EC 81 MG tablet Take 81 mg by mouth at bedtime. Swallow whole.   Yes [provider]  atenolol (TENORMIN) 50 MG tablet Take 50 mg by mouth at bedtime.    Yes [provider]  atorvastatin (  LIPITOR) 40 MG tablet Take 40 mg by mouth at bedtime.   Yes [provider]  ciprofloxacin (CIPRO) 500 MG tablet Take 500 mg by mouth See admin instructions. Bid x 10 days 11/03/20 11/13/20 Yes [provider]  doxycycline (VIBRAMYCIN) 100 MG capsule Take 100 mg by mouth See admin instructions. Bid x 10 days 11/04/20  Yes [provider]  DULoxetine (CYMBALTA) 30 MG capsule Take 90 mg by mouth at bedtime.   Yes [provider]  hydrochlorothiazide (MICROZIDE) 12.5 MG capsule Take 12.5 mg by mouth at bedtime.    Yes [provider]  HYDROcodone-acetaminophen (NORCO) 10-325 MG tablet Take 1 tablet by mouth every 6 (six) hours as needed (for breakthrough pain).    Yes [provider]  morphine (MS CONTIN) 60 MG 12 hr tablet Take 120 mg by mouth at bedtime.   Yes [provider]  methocarbamol (ROBAXIN) 500 MG tablet Take 1 tablet (500 mg total) by mouth 2 (two) times daily as needed for muscle spasms. Patient not taking: Reported on 11/05/2020 05/31/18   Eber Hong, MD    Allergies    Sulfa antibiotics and Sulfamethoxazole  Review of Systems   Review of Systems  Constitutional: Positive for chills and fever.  All other systems reviewed and are negative.   Physical Exam Updated Vital Signs BP 115/77   Pulse 70   Temp 99.2 F (37.3 C)   Resp 16   SpO2 100%   Physical Exam Vitals and nursing note reviewed.  Constitutional:      General: She is not in acute distress.    Appearance: She is well-developed. She is not ill-appearing.  HENT:     Head: Normocephalic and atraumatic.  Eyes:     Conjunctiva/sclera: Conjunctivae normal.  Cardiovascular:     Rate and Rhythm: Normal rate and regular rhythm.  Pulmonary:     Effort: Pulmonary effort is normal. No respiratory distress.     Breath sounds: Normal breath sounds.  Abdominal:     General: Bowel sounds are normal.     Palpations: Abdomen is soft.     Tenderness: There is no abdominal tenderness. There is no guarding or rebound.  Skin:    General: Skin is warm.     Comments: Tick bite site: Left hip at the waistline with erythematous papule with mild surrounding signs of erythema.  See image below  Neurological:     Mental Status: She is alert.  Psychiatric:        Behavior: Behavior normal.     Left hip at waistline  ED Results / Procedures / Treatments   Labs (all labs ordered are listed, but only abnormal results are displayed) Labs Reviewed  COMPREHENSIVE METABOLIC PANEL - Abnormal; Notable for the following components:      Result Value   Sodium 133 (*)    Potassium 3.2 (*)    Chloride 93 (*)    Glucose, Bld 166 (*)    Creatinine, Ser 1.09 (*)     Albumin 3.1 (*)    AST 55 (*)    ALT 71 (*)    Alkaline Phosphatase 131 (*)    GFR, Estimated 57 (*)    All other components within normal limits  CBC - Abnormal; Notable for the following components:   RBC 3.73 (*)    Hemoglobin 11.3 (*)    HCT 35.1 (*)    All other components within normal limits  URINALYSIS, ROUTINE W REFLEX MICROSCOPIC - Abnormal; Notable  for the following components:   Hgb urine dipstick MODERATE (*)    Protein, ur 30 (*)    Leukocytes,Ua TRACE (*)    All other components within normal limits  CBG MONITORING, ED - Abnormal; Notable for the following components:   Glucose-Capillary 160 (*)    All other components within normal limits  RESP PANEL BY RT-PCR (FLU A&B, COVID) ARPGX2  URINE CULTURE  CULTURE, BLOOD (ROUTINE X 2)  CULTURE, BLOOD (ROUTINE X 2)  LACTIC ACID, PLASMA  LACTIC ACID, PLASMA  CREATININE, URINE, RANDOM  SODIUM, URINE, RANDOM  OSMOLALITY, URINE  HEPATITIS PANEL, ACUTE  HIV ANTIBODY (ROUTINE TESTING W REFLEX)  CK  MAGNESIUM  PHOSPHORUS  LYME DISEASE DNA BY PCR(BORRELIA BURG)  EHRLICHIA ANTIBODY PANEL  LACTATE DEHYDROGENASE  SEDIMENTATION RATE  C-REACTIVE PROTEIN  PROCALCITONIN  FIBRINOGEN  FERRITIN  MAGNESIUM  PHOSPHORUS  CBC WITH DIFFERENTIAL/PLATELET  TSH  COMPREHENSIVE METABOLIC PANEL    EKG None  Radiology DG Chest Port 1 View  Result Date: 11/05/2020 CLINICAL DATA:  Fever of unknown source. EXAM: PORTABLE CHEST 1 VIEW COMPARISON:  Radiograph 06/19/2018 FINDINGS: The cardiomediastinal contours are normal. The lungs are clear. Pulmonary vasculature is normal. No consolidation, pleural effusion, or pneumothorax. No acute osseous abnormalities are seen. Overlying artifact may be related to clothing. IMPRESSION: No acute chest findings or explanation for fever. Electronically Signed   By: Narda RutherfordMelanie  Sanford M.D.   On: 11/05/2020 19:33     Procedures Procedures   Medications Ordered in ED Medications  atorvastatin  (LIPITOR) tablet 40 mg (40 mg Oral Given 11/05/20 2318)  DULoxetine (CYMBALTA) DR capsule 90 mg (has no administration in time range)  acetaminophen (TYLENOL) tablet 650 mg (has no administration in time range)    Or  acetaminophen (TYLENOL) suppository 650 mg (has no administration in time range)  HYDROcodone-acetaminophen (NORCO/VICODIN) 5-325 MG per tablet 1-2 tablet (has no administration in time range)  0.9 %  sodium chloride infusion ( Intravenous New Bag/Given 11/05/20 2317)  potassium chloride 10 mEq in 100 mL IVPB (10 mEq Intravenous New Bag/Given 11/05/20 2318)  cefTRIAXone (ROCEPHIN) 1 g in sodium chloride 0.9 % 100 mL IVPB (has no administration in time range)  doxycycline (VIBRAMYCIN) 100 mg in sodium chloride 0.9 % 250 mL IVPB (0 mg Intravenous Stopped 11/05/20 2309)    ED Course  I have reviewed the triage vital signs and the nursing notes.  Pertinent labs & imaging results that were available during my care of the patient were reviewed by me and considered in my medical decision making (see chart for details).  Clinical Course as of 11/06/20 0024  Sat Nov 05, 2020  2005 Consulted with Dr. Algis LimingVanDam with infectious disease.  Recommends IV doxycycline and observe overnight for any change in status.  Discussed utility of CT chest abdomen pelvis, considering global shortage of IV contrast dye and absence of symptoms may not be as beneficial.  Also discussed LP, and MRI imaging of the brain.  Also with absence of symptoms of meningitis or persisting confusion do not believe that is warranted at this time either. [JR]    Clinical Course User Index [JR] Lan Mcneill, [place] N, PA-C   MDM Rules/Calculators/A&P                          Patient is a fit 65 year old female presenting for evaluation of persistent fever over the last week.  Found a tick on her left hip Thursday evening, was started  on doxycycline on Friday.  However was also taking Cipro that was started on Thursday by urgent care  to treat potential UTI.  Overall though she is asymptomatic without any other obvious infectious source.    VSS. Blood work obtained which shows normal white blood cell count.  Hemoglobin of 11.3.  Metabolic panel with very mild transaminitis, creatinine 1.1, UA with moderate hemoglobin, trace leukocytes, normal white cells and no bacteria.  Chest x-ray is clear.  Consideration of patient's recent tick bite, discussed work-up and care plan with infectious disease Dr. Algis Liming.  Discussed imaging versus LP.  No symptoms concerning for meningitis, do not believe LP is warranted at this time.  Only had brief episode of disorientation which occurred when she was woken from sleep and shortly after return to her baseline.  Also discussed CT imaging of the chest abdomen pelvis though with clear chest x-ray and benign abdomen, seems less likely to be intra-abdominal abscess or intrathoracic infection.  At this time recommends IV doxycycline and admission for monitoring to ensure patient does not acutely worsen.  Hepatitis panel, HIV ordered.  +/- inflammatory markers.  Patient is in agreement with care plan.  Final Clinical Impression(s) / ED Diagnoses Final diagnoses:  Fever of unknown origin  Tick bite of abdominal wall, initial encounter    Rx / DC Orders ED Discharge Orders    None       Garey Alleva, [place] N, PA-C 11/06/20 0031    Rozelle Logan, DO 11/06/20 1732

## 2020-11-05 NOTE — H&P (Signed)
Patricia Davidson EAV:[id number] DOB: Dec 13, 1955 DOA: 11/05/2020    PCP: Patient, No Pcp Per (Inactive)   Outpatient Specialists:  NONE   Patient arrived to ER on 11/05/20 at 1542 Referred by Attending Horton, Clabe Seal, DO   Patient coming from: home Lives alone,        Chief Complaint:   Chief Complaint  Patient presents with  . Fever    HPI: Patricia Davidson is a 65 y.o. female with medical history significant of chronic pain, HLD, HTN obesity    Presented with   Fever up to 104 Recently diagnosed with UTI/pyelonephritis and started on Cipro 1 wk ago.  Was also found a tick on herself and started on doxycycline with IM injection.  She continued a significant fever and became somewhat confused. She was unable to tell her daughter where she was today Family reports negative COVID test this week x3 Patient reports mild twinge of pain on the left abdomen she is on chronic pain medications  Been using ibuprofen and Tylenol to try to control her fever Otherwise no runny nose no sore throat no nausea vomiting or diarrhea she is somewhat constipated  Infectious risk factors:  Reports  Fever,     Has  been vaccinated against COVID and boosted   Initial COVID TEST  NEGATIVE   Lab Results  Component Value Date   SARSCOV2NAA NEGATIVE 11/05/2020   SARSCOV2NAA DETECTED (A) 02/11/2019   SARSCOV2NAA Not Detected 01/12/2019      obesity-   BMI Readings from Last 1 Encounters:  06/19/18 34.58 kg/m     While in ER: Found to have elevated LFTs and AKI Normal lactic acid UA appeared to be unremarkable cultures obtained chest x-ray was also unremarkable given elevated LFTs hepatitis panel was ordered she was given doxycycline    ED Triage Vitals  Enc Vitals Group     BP 11/05/20 1550 (!) 158/95     Pulse Rate 11/05/20 1550 87     Resp 11/05/20 1550 16     Temp 11/05/20 1550 99.2 F (37.3 C)     Temp src --      SpO2 11/05/20 1550 98 %     Weight --      Height --       Head Circumference --      Peak Flow --      Pain Score 11/05/20 1549 0     Pain Loc --      Pain Edu? --      Excl. in GC? --   TMAX(24)@     _________________________________________ Significant initial  Findings: Abnormal Labs Reviewed  COMPREHENSIVE METABOLIC PANEL - Abnormal; Notable for the following components:      Result Value   Sodium 133 (*)    Potassium 3.2 (*)    Chloride 93 (*)    Glucose, Bld 166 (*)    Creatinine, Ser 1.09 (*)    Albumin 3.1 (*)    AST 55 (*)    ALT 71 (*)    Alkaline Phosphatase 131 (*)    GFR, Estimated 57 (*)    All other components within normal limits  CBC - Abnormal; Notable for the following components:   RBC 3.73 (*)    Hemoglobin 11.3 (*)    HCT 35.1 (*)    All other components within normal limits  URINALYSIS, ROUTINE W REFLEX MICROSCOPIC - Abnormal; Notable for the following components:   Hgb urine dipstick MODERATE (*)  Protein, ur 30 (*)    Leukocytes,Ua TRACE (*)    All other components within normal limits  CBG MONITORING, ED - Abnormal; Notable for the following components:   Glucose-Capillary 160 (*)    All other components within normal limits   ____________________________________________ Ordered   CXR - NON acute   ECG: Ordered  __  The recent clinical data is shown below. Vitals:   11/05/20 1810 11/05/20 1830 11/05/20 1930 11/05/20 2000  BP: 132/75 118/77 (!) 135/119 138/69  Pulse: 67 70 82 60  Resp:  18 17 16   Temp:      SpO2: 100% 97% 98% 92%      WBC     Component Value Date/Time   WBC 7.4 11/05/2020 1645   LYMPHSABS 2.0 06/19/2018 2058   MONOABS 0.7 06/19/2018 2058   EOSABS 0.1 06/19/2018 2058   BASOSABS 0.0 06/19/2018 2058    Lactic Acid, Venous    Component Value Date/Time   LATICACIDVEN 1.5 11/05/2020 1702    Procalcitonin   Ordered Lactic Acid, Venous    Component Value Date/Time   LATICACIDVEN 1.1 11/05/2020 1902      UA hematuria   Urine analysis:    Component Value  Date/Time   COLORURINE YELLOW 11/05/2020 1702   APPEARANCEUR CLEAR 11/05/2020 1702   LABSPEC 1.021 11/05/2020 1702   PHURINE 5.0 11/05/2020 1702   GLUCOSEU NEGATIVE 11/05/2020 1702   HGBUR MODERATE (A) 11/05/2020 1702   BILIRUBINUR NEGATIVE 11/05/2020 1702   KETONESUR NEGATIVE 11/05/2020 1702   PROTEINUR 30 (A) 11/05/2020 1702   NITRITE NEGATIVE 11/05/2020 1702   LEUKOCYTESUR TRACE (A) 11/05/2020 1702    Results for orders placed or performed during the hospital encounter of 11/05/20  Resp Panel by RT-PCR (Flu A&B, Covid) Nasopharyngeal Swab     Status: None   Collection Time: 11/05/20  9:11 PM   Specimen: Nasopharyngeal Swab; Nasopharyngeal(NP) swabs in vial transport medium  Result Value Ref Range Status   SARS Coronavirus 2 by RT PCR NEGATIVE NEGATIVE Final         Influenza A by PCR NEGATIVE NEGATIVE Final   Influenza B by PCR NEGATIVE NEGATIVE Final          _______________________________________________________ ER Provider Called:    Infectious disease Dr. 11/07/20 They Recommend admit to medicine  Will see in AM    _______________________________________________ Hospitalist was called for admission for SIRS and tick borne illness  The following Work up has been ordered so far:  Orders Placed This Encounter  Procedures  . Urine culture  . Culture, blood (routine x 2)  . DG Chest Port 1 View  . Comprehensive metabolic panel  . CBC  . Urinalysis, Routine w reflex microscopic Urine, Clean Catch  . Lactic acid, plasma  . Hepatitis panel, acute  . HIV Antibody (routine testing w rflx)  . Diet NPO time specified  . Document Height and Actual Weight  . Consult to infectious diseases  . Consult for [facility] Admission  . CBG monitoring, ED    Following Medications were ordered in ER: Medications  doxycycline (VIBRAMYCIN) 100 mg in sodium chloride 0.9 % 250 mL IVPB (100 mg Intravenous New Bag/Given 11/05/20 2045)    OTHER Significant initial   Findings:  labs showing:    Recent Labs  Lab 11/05/20 1645  NA 133*  K 3.2*  CO2 28  GLUCOSE 166*  BUN 21  CREATININE 1.09*  CALCIUM 9.0    Cr   Up from baseline see  below Lab Results  Component Value Date   CREATININE 1.09 (H) 11/05/2020   CREATININE 0.50 06/24/2018   CREATININE 0.57 06/21/2018    Recent Labs  Lab 11/05/20 1645  AST 55*  ALT 71*  ALKPHOS 131*  BILITOT 0.5  PROT 7.4  ALBUMIN 3.1*   Lab Results  Component Value Date   CALCIUM 9.0 11/05/2020       Plt: Lab Results  Component Value Date   PLT 240 11/05/2020       Recent Labs  Lab 11/05/20 1645  WBC 7.4  HGB 11.3*  HCT 35.1*  MCV 94.1  PLT 240    HG/HCT stable     Component Value Date/Time   HGB 11.3 (L) 11/05/2020 1645   HCT 35.1 (L) 11/05/2020 1645   MCV 94.1 11/05/2020 1645        DM  labs:  HbA1C: No results for input(s): HGBA1C in the last 8760 hours.     CBG (last 3)  Recent Labs    11/05/20 1554  GLUCAP 160*     Cultures:    Component Value Date/Time   SDES THROAT 02/11/2019 1235   SPECREQUEST NONE 02/11/2019 1235   CULT  02/11/2019 1235    NO GROUP A STREP (S.PYOGENES) ISOLATED Performed at [facility], [address] [place], [place] [address]    REPTSTATUS 02/13/2019 FINAL 02/11/2019 1235     Radiological Exams on Admission: DG Chest Port 1 View  Result Date: 11/05/2020 CLINICAL DATA:  Fever of unknown source. EXAM: PORTABLE CHEST 1 VIEW COMPARISON:  Radiograph 06/19/2018 FINDINGS: The cardiomediastinal contours are normal. The lungs are clear. Pulmonary vasculature is normal. No consolidation, pleural effusion, or pneumothorax. No acute osseous abnormalities are seen. Overlying artifact may be related to clothing. IMPRESSION: No acute chest findings or explanation for fever. Electronically Signed   By: Narda Rutherford M.D.   On: 11/05/2020 19:33   CT RENAL STONE STUDY  Result Date: 11/05/2020 CLINICAL DATA:  Sepsis.  Flank pain. EXAM: CT  ABDOMEN AND PELVIS WITHOUT CONTRAST TECHNIQUE: Multidetector CT imaging of the abdomen and pelvis was performed following the standard protocol without IV contrast. COMPARISON:  CT 06/19/2018 FINDINGS: Lower chest: The lung bases are clear. No pleural fluid or airspace opacity. Hepatobiliary: Diffusely decreased hepatic density consistent with steatosis. Mild focal fatty sparing adjacent to the gallbladder fossa. Enlarged liver spanning 20 cm cranial caudal. No evidence of focal hepatic abnormality on noncontrast exam. Gallbladder physiologically distended, no calcified stone. No biliary dilatation. Pancreas: No ductal dilatation or inflammation. Spleen: Normal in size without focal abnormality. Adrenals/Urinary Tract: Obstructing 10 x 12 mm stone in the left proximal ureter just beyond the ureteropelvic junction with moderate left hydroureteronephrosis. Mild left perinephric edema. Nonobstructing 7 mm stone in the lower left kidney. Stone in the right renal pelvis measures 1.9 x 1.2 cm. There is slight prominence of the renal pelvis but no calyceal dilatation. Punctate nonobstructing stone in the lower right kidney. No significant right perinephric edema. Small cyst in the posterior right renal cortex. Partially distended urinary bladder. Stomach/Bowel: Moderate to large volume of stool throughout the colon with colonic tortuosity, constipation pattern. Low lying cecum in the right pelvis. Appendectomy per history. No bowel obstruction or inflammation. Decompressed stomach. Vascular/Lymphatic: Normal caliber abdominal aorta. Mild atherosclerosis. Scattered small retroperitoneal lymph nodes are likely reactive. No enlarged lymph nodes in the abdomen or pelvis. Reproductive: Post hysterectomy.  No adnexal mass. Other: Tiny fat containing umbilical hernia. No ascites, free air,  or focal fluid collection. Musculoskeletal: Degenerative change throughout the lumbar spine. Anterolisthesis of L4 on L5 likely due to  prominent facet hypertrophy. There are no acute or suspicious osseous abnormalities. IMPRESSION: 1. Obstructing 10 x 12 mm stone in the left proximal ureter just beyond the ureteropelvic junction with moderate left hydroureteronephrosis. 2. Large stone in the right renal pelvis with slight pelviectasis, but no frank hydronephrosis or inflammation. 3. Additional nonobstructing stones in both kidneys. 4. Hepatic steatosis and hepatomegaly. 5. Moderate to large volume of stool throughout the colon with colonic tortuosity, constipation pattern. Aortic Atherosclerosis (ICD10-I70.0). Electronically Signed   By: Narda RutherfordMelanie  Sanford M.D.   On: 11/05/2020 23:08   _______________________________________________________________________________________________________ Latest  Blood pressure 138/69, pulse 60, temperature 99.2 F (37.3 C), resp. rate 16, SpO2 92 %.   Review of Systems:    Pertinent positives include:  Fevers, chills, fatigue, abd pain  Constitutional:  No weight loss, night sweats, , weight loss  HEENT:  No headaches, Difficulty swallowing,Tooth/dental problems,Sore throat,  No sneezing, itching, ear ache, nasal congestion, post nasal drip,  Cardio-vascular:  No chest pain, Orthopnea, PND, anasarca, dizziness, palpitations.no Bilateral lower extremity swelling  GI:  No heartburn, indigestion, abdominal pain, nausea, vomiting, diarrhea, change in bowel habits, loss of appetite, melena, blood in stool, hematemesis Resp:  no shortness of breath at rest. No dyspnea on exertion, No excess mucus, no productive cough, No non-productive cough, No coughing up of blood.No change in color of mucus.No wheezing. Skin:  no rash or lesions. No jaundice GU:  no dysuria, change in color of urine, no urgency or frequency. No straining to urinate.  No flank pain.  Musculoskeletal:  No joint pain or no joint swelling. No decreased range of motion. No back pain.  Psych:  No change in mood or affect. No  depression or anxiety. No memory loss.  Neuro: no localizing neurological complaints, no tingling, no weakness, no double vision, no gait abnormality, no slurred speech, no confusion  All systems reviewed and apart from HOPI all are negative _______________________________________________________________________________________________ Past Medical History:   Past Medical History:  Diagnosis Date  . Anemia   . Arthritis   . Depression   . Distal radius fracture, right    intra-articular  . Headache   . Hypercholesterolemia   . Hypertension        Past Surgical History:  Procedure Laterality Date  . ABDOMINAL HYSTERECTOMY    . ADENOIDECTOMY    . APPENDECTOMY    . COLONOSCOPY    . DILATION AND CURETTAGE OF UTERUS    . KNEE ARTHROPLASTY    . OPEN REDUCTION INTERNAL FIXATION (ORIF) DISTAL RADIAL FRACTURE Right 08/18/2016   Procedure: RIGHT DISTAL RADIUS OPEN REDUCTION INTERNAL FIXATION AND REPAIR AS INDICATED;  Surgeon: Bradly Bienenstockrtmann, Fred, MD;  Location: [facility] OR;  Service: Orthopedics;  Laterality: Right;  . TONSILLECTOMY    . TUBAL LIGATION      Social History:  Ambulatory  Independently     reports that she has never smoked. She has never used smokeless tobacco. She reports that she does not drink alcohol and does not use drugs.   Family History:   Family History  Problem Relation Age of Onset  . Heart disease Mother   . Heart disease Father   . Stroke Father    ______________________________________________________________________________________________ Allergies: Allergies  Allergen Reactions  . Sulfa Antibiotics Other (See Comments)    Childhood allergy (unsure of reaction)  . Sulfamethoxazole Other (See Comments)    Unknown reaction as  child     Prior to Admission medications   Medication Sig Start Date End Date Taking? Authorizing Provider  amphetamine-dextroamphetamine (ADDERALL XR) 30 MG 24 hr capsule Take 30 mg by mouth every morning.    [provider]  amphetamine-dextroamphetamine (ADDERALL) 10 MG tablet Take 10 mg by mouth 2 (two) times daily as needed (as directed).     [provider]  atenolol (TENORMIN) 50 MG tablet Take 50 mg by mouth at bedtime.     [provider]  atorvastatin (LIPITOR) 40 MG tablet Take 40 mg by mouth at bedtime.    [provider]  calcium-vitamin D (OSCAL WITH D) 500-200 MG-UNIT tablet Take 1 tablet by mouth at bedtime.    [provider]  DULoxetine (CYMBALTA) 30 MG capsule Take 90 mg by mouth at bedtime.    [provider]  hydrochlorothiazide (MICROZIDE) 12.5 MG capsule Take 12.5 mg by mouth at bedtime.     [provider]  HYDROcodone-acetaminophen (NORCO) 10-325 MG tablet Take 1 tablet by mouth every 6 (six) hours as needed (for breakthrough pain).    [provider]  methocarbamol (ROBAXIN) 500 MG tablet Take 1 tablet (500 mg total) by mouth 2 (two) times daily as needed for muscle spasms. 05/31/18   Eber Hong, MD  morphine (MS CONTIN) 60 MG 12 hr tablet Take 120 mg by mouth at bedtime.    [provider]    ___________________________________________________________________________________________________ Physical Exam: Vitals with BMI 11/05/2020 11/05/2020 11/05/2020  Height - - -  Weight - - -  BMI - - -  Systolic 138 135 409  Diastolic 69 119 77  Pulse 60 82 70    1. General:  in No Acute distress   Chronically ill -appearing 2. Psychological: Alert and   Oriented 3. Head/ENT:    Dry Mucous Membranes                          Head Non traumatic, neck supple                           Poor Dentition 4. SKIN:   decreased Skin turgor,  Skin clean Dry and intact no rash 5. Heart: Regular rate and rhythm no Murmur, no Rub or gallop 6. Lungs:   no wheezes or crackles   7. Abdomen: Soft,  non-tender, Non distended   Obese  8. Lower extremities: no clubbing, cyanosis, no   edema 9. Neurologically Grossly intact,  moving all 4 extremities equally  10. MSK: Normal range of motion    Chart has been reviewed  ______________________________________________________________________________________________  Assessment/Plan   65 y.o. female with medical history significant of chronic pain, HLD, HTN obesity  Admitted for ureteral stone obstruction and SIRS  Present on Admission:    . Ureteral stone - given hematuria renal CT was ordered and found to have 43mm stone, paged Urology, added rocephin and kept NPO  . Tick bite of abdomen - continue doxycycline, order tick borne illness work up  Appreciate ID consult . Hypokalemia - - will replace and repeat in AM,  check magnesium level and replace as needed  . AKI (acute kidney injury) (HCC) -rehydrate appreciate urology consult plan for possible procedure in a.m. obtain urine electrolytes  . Hyperlipidemia - hold statin  . Essential hypertension -allow permissive hypertension for tonight  Elevated LFTs -mildly check CK and hepatitis serologies hold off on statin  for now  Other plan as per orders.  DVT prophylaxis:  SCD      Code Status:    Code Status: Prior FULL CODE as per patient   I had personally discussed CODE STATUS with patient     Family Communication:   Family not at  Bedside   Disposition Plan:      To home once workup is complete and patient is stable   Following barriers for discharge:                            Electrolytes corrected                                                   Pain controlled with PO medications                               Afebrile, white count improving able to transition to PO antibiotics                                                     Will need consultants to evaluate patient prior to discharge                      Consults called: UROLOGY     Admission status:  ED Disposition    ED Disposition Condition Comment   Admit  Hospital Area: [facility] [[id number]]  Level of  Care: Progressive [102]  Admit to Progressive based on following criteria: NEPHROLOGY stable condition requiring close monitoring for AKI, requiring Hemodialysis or Peritoneal Dialysis either from expected electrolyte imbalance, acidosis, or fluid overload that can be managed by NIPPV or high flow oxygen.  Covid Evaluation: Confirmed COVID Negative  Diagnosis: SIRS (systemic inflammatory response syndrome) [facility]) [[id number]]  Admitting Physician: Therisa Doyne [[id number]]  Attending Physician: Therisa Doyne [[id number]]        Obs    Level of care   progressive  tele indefinitely please discontinue once patient no longer qualifies COVID-19 Labs    Lab Results  Component Value Date   SARSCOV2NAA NEGATIVE 11/05/2020     Precautions: admitted as  Covid Negative    PPE: Used by the provider:   N95   eye Goggles,  Gloves      Denay Pleitez 11/06/2020, 12:29 AM    [facility]     after 2 AM please page floor coverage PA If 7AM-7PM, please contact the day team taking care of the patient using [email or web address]   Patient was evaluated in the context of the global COVID-19 pandemic, which necessitated consideration that the patient might be at risk for infection with the SARS-CoV-2 virus that causes COVID-19. Institutional protocols and algorithms that pertain to the evaluation of patients at risk for COVID-19 are in a state of rapid change based on information released by regulatory bodies including the CDC and federal and state organizations. These policies and algorithms were followed during the patient's care.

## 2020-11-05 NOTE — ED Triage Notes (Signed)
Pt reports fever x 1 week Tmax 104. Went to UC and was dx with UTI midweek and was started on cipro. Pt then found a tick on her so was started on doxycycline and was given an IM injection. Pt reports fevers have not subsided and she is starting to become intermittently confused. One example was this morning her daughter awoke her out of sleep with phone call, but the patient was unable to tell her daughter where she was. Three negative covid tests this week.

## 2020-11-06 ENCOUNTER — Observation Stay (HOSPITAL_COMMUNITY): Payer: BC Managed Care – PPO | Admitting: Anesthesiology

## 2020-11-06 ENCOUNTER — Observation Stay (HOSPITAL_COMMUNITY): Payer: BC Managed Care – PPO

## 2020-11-06 ENCOUNTER — Encounter (HOSPITAL_COMMUNITY): Payer: Self-pay | Admitting: Internal Medicine

## 2020-11-06 ENCOUNTER — Encounter (HOSPITAL_COMMUNITY)
Admission: EM | Disposition: A | Discharge status: Home / Self Care | Payer: Self-pay | Source: Home / Self Care | Attending: Emergency Medicine

## 2020-11-06 DIAGNOSIS — E785 Hyperlipidemia, unspecified: Secondary | ICD-10-CM | POA: Diagnosis present

## 2020-11-06 DIAGNOSIS — I152 Hypertension secondary to endocrine disorders: Secondary | ICD-10-CM | POA: Diagnosis present

## 2020-11-06 DIAGNOSIS — I10 Essential (primary) hypertension: Secondary | ICD-10-CM | POA: Diagnosis present

## 2020-11-06 DIAGNOSIS — E876 Hypokalemia: Secondary | ICD-10-CM | POA: Diagnosis present

## 2020-11-06 DIAGNOSIS — R7989 Other specified abnormal findings of blood chemistry: Secondary | ICD-10-CM | POA: Diagnosis not present

## 2020-11-06 DIAGNOSIS — R6510 Systemic inflammatory response syndrome (SIRS) of non-infectious origin without acute organ dysfunction: Secondary | ICD-10-CM | POA: Diagnosis not present

## 2020-11-06 DIAGNOSIS — N179 Acute kidney failure, unspecified: Secondary | ICD-10-CM | POA: Diagnosis present

## 2020-11-06 DIAGNOSIS — N201 Calculus of ureter: Secondary | ICD-10-CM | POA: Diagnosis present

## 2020-11-06 DIAGNOSIS — R509 Fever, unspecified: Secondary | ICD-10-CM | POA: Diagnosis not present

## 2020-11-06 DIAGNOSIS — E871 Hypo-osmolality and hyponatremia: Secondary | ICD-10-CM

## 2020-11-06 DIAGNOSIS — S30861A Insect bite (nonvenomous) of abdominal wall, initial encounter: Secondary | ICD-10-CM | POA: Diagnosis present

## 2020-11-06 HISTORY — PX: CYSTOSCOPY WITH URETEROSCOPY AND STENT PLACEMENT: SHX6377

## 2020-11-06 LAB — CK: Total CK: 23 U/L — ABNORMAL LOW (ref 38–234)

## 2020-11-06 LAB — PROCALCITONIN: Procalcitonin: 0.17 ng/mL

## 2020-11-06 LAB — HEPATITIS PANEL, ACUTE
HCV Ab: NONREACTIVE
Hep A IgM: NONREACTIVE
Hep B C IgM: NONREACTIVE
Hepatitis B Surface Ag: NONREACTIVE

## 2020-11-06 LAB — CBC WITH DIFFERENTIAL/PLATELET
Abs Immature Granulocytes: 0.02 10*3/uL (ref 0.00–0.07)
Basophils Absolute: 0 10*3/uL (ref 0.0–0.1)
Basophils Relative: 0 %
Eosinophils Absolute: 0 10*3/uL (ref 0.0–0.5)
Eosinophils Relative: 0 %
HCT: 31.1 % — ABNORMAL LOW (ref 36.0–46.0)
Hemoglobin: 10.2 g/dL — ABNORMAL LOW (ref 12.0–15.0)
Immature Granulocytes: 0 %
Lymphocytes Relative: 20 %
Lymphs Abs: 1.4 10*3/uL (ref 0.7–4.0)
MCH: 30.2 pg (ref 26.0–34.0)
MCHC: 32.8 g/dL (ref 30.0–36.0)
MCV: 92 fL (ref 80.0–100.0)
Monocytes Absolute: 1.3 10*3/uL — ABNORMAL HIGH (ref 0.1–1.0)
Monocytes Relative: 18 %
Neutro Abs: 4.3 10*3/uL (ref 1.7–7.7)
Neutrophils Relative %: 62 %
Platelets: 217 10*3/uL (ref 150–400)
RBC: 3.38 MIL/uL — ABNORMAL LOW (ref 3.87–5.11)
RDW: 13.1 % (ref 11.5–15.5)
WBC: 7 10*3/uL (ref 4.0–10.5)
nRBC: 0 % (ref 0.0–0.2)

## 2020-11-06 LAB — LACTATE DEHYDROGENASE: LDH: 168 U/L (ref 98–192)

## 2020-11-06 LAB — COMPREHENSIVE METABOLIC PANEL
ALT: 57 U/L — ABNORMAL HIGH (ref 0–44)
AST: 39 U/L (ref 15–41)
Albumin: 2.6 g/dL — ABNORMAL LOW (ref 3.5–5.0)
Alkaline Phosphatase: 121 U/L (ref 38–126)
Anion gap: 10 (ref 5–15)
BUN: 15 mg/dL (ref 8–23)
CO2: 28 mmol/L (ref 22–32)
Calcium: 8.8 mg/dL — ABNORMAL LOW (ref 8.9–10.3)
Chloride: 96 mmol/L — ABNORMAL LOW (ref 98–111)
Creatinine, Ser: 0.79 mg/dL (ref 0.44–1.00)
GFR, Estimated: >60 mL/min (ref >60)
Glucose, Bld: 112 mg/dL — ABNORMAL HIGH (ref 70–99)
Potassium: 3.2 mmol/L — ABNORMAL LOW (ref 3.5–5.1)
Sodium: 134 mmol/L — ABNORMAL LOW (ref 135–145)
Total Bilirubin: 0.5 mg/dL (ref 0.3–1.2)
Total Protein: 6.4 g/dL — ABNORMAL LOW (ref 6.5–8.1)

## 2020-11-06 LAB — FERRITIN: Ferritin: 320 ng/mL — ABNORMAL HIGH (ref 11–307)

## 2020-11-06 LAB — FIBRINOGEN: Fibrinogen: 643 mg/dL — ABNORMAL HIGH (ref 210–475)

## 2020-11-06 LAB — MAGNESIUM: Magnesium: 1.8 mg/dL (ref 1.7–2.4)

## 2020-11-06 LAB — TSH: TSH: 0.209 u[IU]/mL — ABNORMAL LOW (ref 0.350–4.500)

## 2020-11-06 LAB — HIV ANTIBODY (ROUTINE TESTING W REFLEX): HIV Screen 4th Generation wRfx: NONREACTIVE

## 2020-11-06 LAB — SEDIMENTATION RATE: Sed Rate: 100 mm/hr — ABNORMAL HIGH (ref 0–22)

## 2020-11-06 LAB — C-REACTIVE PROTEIN: CRP: 13.7 mg/dL — ABNORMAL HIGH (ref <1.0)

## 2020-11-06 LAB — PHOSPHORUS: Phosphorus: 3 mg/dL (ref 2.5–4.6)

## 2020-11-06 SURGERY — CYSTOURETEROSCOPY, WITH STENT INSERTION
Anesthesia: General | Site: Ureter | Laterality: Bilateral

## 2020-11-06 MED ORDER — DEXAMETHASONE SODIUM PHOSPHATE 10 MG/ML IJ SOLN
INTRAMUSCULAR | Status: AC
  Filled 2020-11-06: qty 1

## 2020-11-06 MED ORDER — DEXAMETHASONE SODIUM PHOSPHATE 10 MG/ML IJ SOLN
INTRAMUSCULAR | Status: DC | PRN
  Administered 2020-11-06: 5 mg via INTRAVENOUS

## 2020-11-06 MED ORDER — MORPHINE SULFATE ER 30 MG PO TBCR: 120.0000 mg | EXTENDED_RELEASE_TABLET | Freq: Every day | ORAL | Status: DC

## 2020-11-06 MED ORDER — METOPROLOL TARTRATE 5 MG/5ML IV SOLN
INTRAVENOUS | Status: AC
  Filled 2020-11-06: qty 5

## 2020-11-06 MED ORDER — FENTANYL CITRATE (PF) 250 MCG/5ML IJ SOLN
INTRAMUSCULAR | Status: AC
  Filled 2020-11-06: qty 5

## 2020-11-06 MED ORDER — ORAL CARE MOUTH RINSE: 15.0000 mL | Freq: Once | OROMUCOSAL | Status: AC

## 2020-11-06 MED ORDER — DROPERIDOL 2.5 MG/ML IJ SOLN: 0.6250 mg | Freq: Once | INTRAMUSCULAR | Status: DC | PRN

## 2020-11-06 MED ORDER — HYDROCODONE-ACETAMINOPHEN 10-325 MG PO TABS: 1.0000 | ORAL_TABLET | Freq: Four times a day (QID) | ORAL | Status: DC | PRN

## 2020-11-06 MED ORDER — ONDANSETRON HCL 4 MG/2ML IJ SOLN
INTRAMUSCULAR | Status: DC | PRN
  Administered 2020-11-06: 4 mg via INTRAVENOUS

## 2020-11-06 MED ORDER — FENTANYL CITRATE (PF) 100 MCG/2ML IJ SOLN: 25.0000 ug | INTRAMUSCULAR | Status: DC | PRN

## 2020-11-06 MED ORDER — ATENOLOL 25 MG PO TABS: 25.0000 mg | ORAL_TABLET | Freq: Every day | ORAL | Status: DC

## 2020-11-06 MED ORDER — MIDAZOLAM HCL 2 MG/2ML IJ SOLN
INTRAMUSCULAR | Status: AC
  Filled 2020-11-06: qty 2

## 2020-11-06 MED ORDER — LIDOCAINE HCL URETHRAL/MUCOSAL 2 % EX GEL
CUTANEOUS | Status: AC
  Filled 2020-11-06: qty 11

## 2020-11-06 MED ORDER — DIPHENHYDRAMINE HCL 50 MG/ML IJ SOLN
INTRAMUSCULAR | Status: AC
  Filled 2020-11-06: qty 1

## 2020-11-06 MED ORDER — PHENAZOPYRIDINE HCL 200 MG PO TABS: 200.0000 mg | ORAL_TABLET | Freq: Three times a day (TID) | ORAL | 0 refills | Status: DC | PRN

## 2020-11-06 MED ORDER — OXYBUTYNIN CHLORIDE 5 MG PO TABS: 5.0000 mg | ORAL_TABLET | Freq: Three times a day (TID) | ORAL | 1 refills | Status: DC | PRN

## 2020-11-06 MED ORDER — DIPHENHYDRAMINE HCL 50 MG/ML IJ SOLN
INTRAMUSCULAR | Status: DC | PRN
  Administered 2020-11-06: 12.5 mg via INTRAVENOUS

## 2020-11-06 MED ORDER — LIDOCAINE 2% (20 MG/ML) 5 ML SYRINGE
INTRAMUSCULAR | Status: AC
  Filled 2020-11-06: qty 5

## 2020-11-06 MED ORDER — HYDROCODONE-ACETAMINOPHEN 5-325 MG PO TABS: 1.0000 | ORAL_TABLET | ORAL | Status: DC | PRN

## 2020-11-06 MED ORDER — CHLORHEXIDINE GLUCONATE 0.12 % MT SOLN
15.0000 mL | Freq: Once | OROMUCOSAL | Status: AC
  Administered 2020-11-06: 15 mL via OROMUCOSAL

## 2020-11-06 MED ORDER — HYDROCODONE-ACETAMINOPHEN 10-325 MG PO TABS
1.0000 | ORAL_TABLET | Freq: Four times a day (QID) | ORAL | Status: DC | PRN
  Administered 2020-11-06: 1 via ORAL
  Filled 2020-11-06: qty 1

## 2020-11-06 MED ORDER — METOPROLOL TARTRATE 5 MG/5ML IV SOLN
INTRAVENOUS | Status: DC | PRN
  Administered 2020-11-06: 1 mg via INTRAVENOUS

## 2020-11-06 MED ORDER — ONDANSETRON HCL 4 MG/2ML IJ SOLN
INTRAMUSCULAR | Status: AC
  Filled 2020-11-06: qty 2

## 2020-11-06 MED ORDER — SODIUM CHLORIDE 0.9 % IV SOLN
100.0000 mg | Freq: Two times a day (BID) | INTRAVENOUS | Status: DC
  Administered 2020-11-06: 100 mg via INTRAVENOUS
  Filled 2020-11-06 (×4): qty 100

## 2020-11-06 MED ORDER — 0.9 % SODIUM CHLORIDE (POUR BTL) OPTIME
TOPICAL | Status: DC | PRN
  Administered 2020-11-06: 1000 mL

## 2020-11-06 MED ORDER — LACTATED RINGERS IV SOLN: INTRAVENOUS | Status: DC

## 2020-11-06 MED ORDER — CEPHALEXIN 500 MG PO CAPS: 500.0000 mg | ORAL_CAPSULE | Freq: Four times a day (QID) | ORAL | 0 refills | Status: AC

## 2020-11-06 MED ORDER — EPHEDRINE SULFATE-NACL 50-0.9 MG/10ML-% IV SOSY
PREFILLED_SYRINGE | INTRAVENOUS | Status: DC | PRN
  Administered 2020-11-06 (×2): 5 mg via INTRAVENOUS

## 2020-11-06 MED ORDER — PROPOFOL 10 MG/ML IV BOLUS
INTRAVENOUS | Status: DC | PRN
  Administered 2020-11-06: 200 mg via INTRAVENOUS

## 2020-11-06 MED ORDER — LIDOCAINE 2% (20 MG/ML) 5 ML SYRINGE
INTRAMUSCULAR | Status: DC | PRN
  Administered 2020-11-06: 100 mg via INTRAVENOUS

## 2020-11-06 MED ORDER — MEPERIDINE HCL 25 MG/ML IJ SOLN: 6.2500 mg | INTRAMUSCULAR | Status: DC | PRN

## 2020-11-06 MED ORDER — SODIUM CHLORIDE 0.9 % IV SOLN: 1.0000 g | INTRAVENOUS | Status: DC

## 2020-11-06 MED ORDER — SODIUM CHLORIDE 0.9 % IV SOLN
INTRAVENOUS | Status: DC | PRN
  Administered 2020-11-06: 15 mL

## 2020-11-06 MED ORDER — PROPOFOL 10 MG/ML IV BOLUS
INTRAVENOUS | Status: AC
  Filled 2020-11-06: qty 20

## 2020-11-06 MED ORDER — FENTANYL CITRATE (PF) 250 MCG/5ML IJ SOLN
INTRAMUSCULAR | Status: DC | PRN
  Administered 2020-11-06: 50 ug via INTRAVENOUS

## 2020-11-06 MED ORDER — STERILE WATER FOR IRRIGATION IR SOLN
Status: DC | PRN
  Administered 2020-11-06: 3000 mL

## 2020-11-06 MED ORDER — MIDAZOLAM HCL 2 MG/2ML IJ SOLN
INTRAMUSCULAR | Status: DC | PRN
  Administered 2020-11-06: 2 mg via INTRAVENOUS

## 2020-11-06 SURGICAL SUPPLY — 21 items
BAG URINE DRAIN 2000ML AR STRL (UROLOGICAL SUPPLIES) ×2 IMPLANT
BAG URO CATCHER STRL LF (MISCELLANEOUS) ×2 IMPLANT
CATH FOLEY 2WAY SLVR  5CC 16FR (CATHETERS)
CATH FOLEY 2WAY SLVR 5CC 16FR (CATHETERS) IMPLANT
CATH URET 5FR 28IN OPEN ENDED (CATHETERS) ×2 IMPLANT
GLOVE BIOGEL M STRL SZ7.5 (GLOVE) ×6 IMPLANT
GOWN STRL REUS W/ TWL LRG LVL3 (GOWN DISPOSABLE) ×1 IMPLANT
GOWN STRL REUS W/TWL LRG LVL3 (GOWN DISPOSABLE) ×1
GOWN STRL REUS W/TWL XL LVL3 (GOWN DISPOSABLE) ×2 IMPLANT
GUIDEWIRE ANG ZIPWIRE 038X150 (WIRE) ×2 IMPLANT
GUIDEWIRE STR DUAL SENSOR (WIRE) ×2 IMPLANT
KIT TURNOVER KIT B (KITS) ×2 IMPLANT
MANIFOLD NEPTUNE II (INSTRUMENTS) ×8 IMPLANT
NS IRRIG 1000ML POUR BTL (IV SOLUTION) ×2 IMPLANT
PACK CYSTO (CUSTOM PROCEDURE TRAY) ×2 IMPLANT
STENT URET 6FRX24 CONTOUR (STENTS) IMPLANT
STENT URET 6FRX26 CONTOUR (STENTS) IMPLANT
SYPHON OMNI JUG (MISCELLANEOUS) IMPLANT
TOWEL GREEN STERILE FF (TOWEL DISPOSABLE) ×2 IMPLANT
TUBE CONNECTING 12X1/4 (SUCTIONS) ×2 IMPLANT
WATER STERILE IRR 3000ML UROMA (IV SOLUTION) ×2 IMPLANT

## 2020-11-06 NOTE — Anesthesia Postprocedure Evaluation (Signed)
Anesthesia Post Note  Patient: Patricia Davidson  Procedure(s) Performed: CYSTOSCOPY WITH URETEROSCOPY AND STENT PLACEMENT (Bilateral Ureter)     Patient location during evaluation: PACU Anesthesia Type: General Level of consciousness: sedated and patient cooperative Pain management: pain level controlled Vital Signs Assessment: post-procedure vital signs reviewed and stable Respiratory status: spontaneous breathing Cardiovascular status: stable Anesthetic complications: no   No complications documented.  Last Vitals:  Vitals:   11/06/20 1154 11/06/20 1209  BP: (!) 146/74 (!) 149/75  Pulse: 63 76  Resp: 20 14  Temp:    SpO2: 100% 100%    Last Pain:  Vitals:   11/06/20 1209  TempSrc:   PainSc: 0-No pain                 Lewie Loron

## 2020-11-06 NOTE — OR Nursing (Signed)
BILATERAL URETERAL STENTS  INSERTED BY [employer]; HTX#[id number]; YEB#[id number]; EXP, 08/08/23; SIZE 68F X 24 CM .

## 2020-11-06 NOTE — Progress Notes (Signed)
Pt discharged per MD order. IV and tele removed. Instruction reviews, pt verbalized understanding. Questions answered to satisfaction.  Pt escorted to personal vehicle.   Jadda Hunsucker M

## 2020-11-06 NOTE — Consult Note (Signed)
Urology Consult   Physician requesting consult: Lana Fish, MD  Reason for consult: Obstructing left ureteral stone  History of Present Illness: Patricia Davidson is a 65 y.o. female with an obstructing 12 mm left UPJ calculus associated with moderate left-sided hydronephrosis.  She also has a 19 mm nonobstructing right renal stone.  Initially presented to the emergency department with a 1 week history of intermittent episodes of sharp left-sided flank pain associated with vomiting and subjective fevers of 102 F.  She is currently being treated with doxycycline after a tick bite.  The patient denies a history of voiding or storage urinary symptoms, hematuria, UTIs, STDs, urolithiasis, GU malignancy/trauma/surgery.  Past Medical History:  Diagnosis Date  . Anemia   . Arthritis   . Depression   . Distal radius fracture, right    intra-articular  . Headache   . Hypercholesterolemia   . Hypertension     Past Surgical History:  Procedure Laterality Date  . ABDOMINAL HYSTERECTOMY    . ADENOIDECTOMY    . APPENDECTOMY    . COLONOSCOPY    . DILATION AND CURETTAGE OF UTERUS    . KNEE ARTHROPLASTY    . OPEN REDUCTION INTERNAL FIXATION (ORIF) DISTAL RADIAL FRACTURE Right 08/18/2016   Procedure: RIGHT DISTAL RADIUS OPEN REDUCTION INTERNAL FIXATION AND REPAIR AS INDICATED;  Surgeon: Bradly Bienenstock, MD;  Location: [facility] OR;  Service: Orthopedics;  Laterality: Right;  . TONSILLECTOMY    . TUBAL LIGATION      Current Hospital Medications:  Home Meds:  Current Meds  Medication Sig  . amphetamine-dextroamphetamine (ADDERALL) 15 MG tablet Take 15 mg by mouth daily as needed.  Marland Kitchen aspirin EC 81 MG tablet Take 81 mg by mouth at bedtime. Swallow whole.  Marland Kitchen atenolol (TENORMIN) 50 MG tablet Take 50 mg by mouth at bedtime.   Marland Kitchen atorvastatin (LIPITOR) 40 MG tablet Take 40 mg by mouth at bedtime.  . ciprofloxacin (CIPRO) 500 MG tablet Take 500 mg by mouth See admin instructions. Bid x 10 days  .  doxycycline (VIBRAMYCIN) 100 MG capsule Take 100 mg by mouth See admin instructions. Bid x 10 days  . DULoxetine (CYMBALTA) 30 MG capsule Take 90 mg by mouth at bedtime.  . hydrochlorothiazide (MICROZIDE) 12.5 MG capsule Take 12.5 mg by mouth at bedtime.   Marland Kitchen HYDROcodone-acetaminophen (NORCO) 10-325 MG tablet Take 1 tablet by mouth every 6 (six) hours as needed (for breakthrough pain).  Marland Kitchen morphine (MS CONTIN) 60 MG 12 hr tablet Take 120 mg by mouth at bedtime.    Scheduled Meds: . DULoxetine  90 mg Oral QHS   Continuous Infusions: . sodium chloride 100 mL/hr at 11/05/20 2317  . [START ON 11/08/2020] cefTRIAXone (ROCEPHIN)  IV    . doxycycline (VIBRAMYCIN) IV     PRN Meds:.acetaminophen **OR** acetaminophen, HYDROcodone-acetaminophen  Allergies:  Allergies  Allergen Reactions  . Sulfa Antibiotics Other (See Comments)    Childhood allergy (unsure of reaction)  . Sulfamethoxazole Other (See Comments)    Unknown reaction as child    Family History  Problem Relation Age of Onset  . Heart disease Mother   . Heart disease Father   . Stroke Father     Social History:  reports that she has never smoked. She has never used smokeless tobacco. She reports that she does not drink alcohol and does not use drugs.  ROS: A complete review of systems was performed.  All systems are negative except for pertinent findings as noted.  Physical Exam:  Vital signs in last 24 hours: Temp:  [98.9 F (37.2 C)-99.2 F (37.3 C)] 99 F (37.2 C) (05/15 0544) Pulse Rate:  [60-87] 80 (05/15 0544) Resp:  [15-27] 17 (05/15 0544) BP: (115-159)/(68-119) 155/81 (05/15 0544) SpO2:  [91 %-100 %] 98 % (05/15 0544) Constitutional:  Alert and oriented, No acute distress Cardiovascular: Regular rate and rhythm, No JVD Respiratory: Normal respiratory effort, Lungs clear bilaterally GI: Abdomen is soft, nontender, nondistended, no abdominal masses GU: No CVA tenderness Lymphatic: No lymphadenopathy Neurologic:  Grossly intact, no focal deficits Psychiatric: Normal mood and affect  Laboratory Data:  Recent Labs    11/05/20 1645 11/06/20 0341  WBC 7.4 7.0  HGB 11.3* 10.2*  HCT 35.1* 31.1*  PLT 240 217    Recent Labs    11/05/20 1645 11/06/20 0341  NA 133* 134*  K 3.2* 3.2*  CL 93* 96*  GLUCOSE 166* 112*  BUN 21 15  CALCIUM 9.0 8.8*  CREATININE 1.09* 0.79     Results for orders placed or performed during the hospital encounter of 11/05/20 (from the past 24 hour(s))  CBG monitoring, ED     Status: Abnormal   Collection Time: 11/05/20  3:54 PM  Result Value Ref Range   Glucose-Capillary 160 (H) 70 - 99 mg/dL  Comprehensive metabolic panel     Status: Abnormal   Collection Time: 11/05/20  4:45 PM  Result Value Ref Range   Sodium 133 (L) 135 - 145 mmol/L   Potassium 3.2 (L) 3.5 - 5.1 mmol/L   Chloride 93 (L) 98 - 111 mmol/L   CO2 28 22 - 32 mmol/L   Glucose, Bld 166 (H) 70 - 99 mg/dL   BUN 21 8 - 23 mg/dL   Creatinine, Ser 9.56 (H) 0.44 - 1.00 mg/dL   Calcium 9.0 8.9 - 21.3 mg/dL   Total Protein 7.4 6.5 - 8.1 g/dL   Albumin 3.1 (L) 3.5 - 5.0 g/dL   AST 55 (H) 15 - 41 U/L   ALT 71 (H) 0 - 44 U/L   Alkaline Phosphatase 131 (H) 38 - 126 U/L   Total Bilirubin 0.5 0.3 - 1.2 mg/dL   GFR, Estimated 57 (L) >60 mL/min   Anion gap 12 5 - 15  CBC     Status: Abnormal   Collection Time: 11/05/20  4:45 PM  Result Value Ref Range   WBC 7.4 4.0 - 10.5 K/uL   RBC 3.73 (L) 3.87 - 5.11 MIL/uL   Hemoglobin 11.3 (L) 12.0 - 15.0 g/dL   HCT 08.6 (L) 57.8 - 46.9 %   MCV 94.1 80.0 - 100.0 fL   MCH 30.3 26.0 - 34.0 pg   MCHC 32.2 30.0 - 36.0 g/dL   RDW 62.9 52.8 - 41.3 %   Platelets 240 150 - 400 K/uL   nRBC 0.0 0.0 - 0.2 %  Urinalysis, Routine w reflex microscopic Urine, Random     Status: Abnormal   Collection Time: 11/05/20  5:02 PM  Result Value Ref Range   Color, Urine YELLOW YELLOW   APPearance CLEAR CLEAR   Specific Gravity, Urine 1.021 1.005 - 1.030   pH 5.0 5.0 - 8.0    Glucose, UA NEGATIVE NEGATIVE mg/dL   Hgb urine dipstick MODERATE (A) NEGATIVE   Bilirubin Urine NEGATIVE NEGATIVE   Ketones, ur NEGATIVE NEGATIVE mg/dL   Protein, ur 30 (A) NEGATIVE mg/dL   Nitrite NEGATIVE NEGATIVE   Leukocytes,Ua TRACE (A) NEGATIVE   RBC / HPF 21-50 0 - 5  RBC/hpf   WBC, UA 0-5 0 - 5 WBC/hpf   Bacteria, UA NONE SEEN NONE SEEN   Squamous Epithelial / LPF 0-5 0 - 5   Mucus PRESENT   Lactic acid, plasma     Status: None   Collection Time: 11/05/20  5:02 PM  Result Value Ref Range   Lactic Acid, Venous 1.5 0.5 - 1.9 mmol/L  Culture, blood (routine x 2)     Status: None (Preliminary result)   Collection Time: 11/05/20  6:05 PM   Specimen: BLOOD  Result Value Ref Range   Specimen Description BLOOD LEFT ANTECUBITAL    Special Requests      BOTTLES DRAWN AEROBIC AND ANAEROBIC Blood Culture results may not be optimal due to an inadequate volume of blood received in culture bottles Performed at [facility], [address] [place], [place] [address]    Culture PENDING    Report Status PENDING   Culture, blood (routine x 2)     Status: None (Preliminary result)   Collection Time: 11/05/20  6:05 PM   Specimen: BLOOD  Result Value Ref Range   Specimen Description BLOOD RIGHT ANTECUBITAL    Special Requests      BOTTLES DRAWN AEROBIC AND ANAEROBIC Blood Culture results may not be optimal due to an inadequate volume of blood received in culture bottles Performed at [facility], [address] [place], [place] [address]    Culture PENDING    Report Status PENDING   Lactic acid, plasma     Status: None   Collection Time: 11/05/20  7:02 PM  Result Value Ref Range   Lactic Acid, Venous 1.1 0.5 - 1.9 mmol/L  Resp Panel by RT-PCR (Flu A&B, Covid) Nasopharyngeal Swab     Status: None   Collection Time: 11/05/20  9:11 PM   Specimen: Nasopharyngeal Swab; Nasopharyngeal(NP) swabs in vial transport medium  Result Value Ref Range   SARS Coronavirus 2 by RT PCR  NEGATIVE NEGATIVE   Influenza A by PCR NEGATIVE NEGATIVE   Influenza B by PCR NEGATIVE NEGATIVE  Creatinine, urine, random     Status: None   Collection Time: 11/05/20  9:12 PM  Result Value Ref Range   Creatinine, Urine 153.59 mg/dL  Sodium, urine, random     Status: None   Collection Time: 11/05/20  9:12 PM  Result Value Ref Range   Sodium, Ur 25 mmol/L  Osmolality, urine     Status: None   Collection Time: 11/05/20  9:29 PM  Result Value Ref Range   Osmolality, Ur 614 300 - 900 mOsm/kg  CK     Status: Abnormal   Collection Time: 11/06/20  3:41 AM  Result Value Ref Range   Total CK 23 (L) 38 - 234 U/L  Magnesium     Status: None   Collection Time: 11/06/20  3:41 AM  Result Value Ref Range   Magnesium 1.8 1.7 - 2.4 mg/dL  Lactate dehydrogenase     Status: None   Collection Time: 11/06/20  3:41 AM  Result Value Ref Range   LDH 168 98 - 192 U/L  Sedimentation rate     Status: Abnormal   Collection Time: 11/06/20  3:41 AM  Result Value Ref Range   Sed Rate 100 (H) 0 - 22 mm/hr  C-reactive protein     Status: Abnormal   Collection Time: 11/06/20  3:41 AM  Result Value Ref Range   CRP 13.7 (H) <1.0 mg/dL  Procalcitonin  Status: None   Collection Time: 11/06/20  3:41 AM  Result Value Ref Range   Procalcitonin 0.17 ng/mL  Fibrinogen     Status: Abnormal   Collection Time: 11/06/20  3:41 AM  Result Value Ref Range   Fibrinogen 643 (H) 210 - 475 mg/dL  Ferritin     Status: Abnormal   Collection Time: 11/06/20  3:41 AM  Result Value Ref Range   Ferritin 320 (H) 11 - 307 ng/mL  Phosphorus     Status: None   Collection Time: 11/06/20  3:41 AM  Result Value Ref Range   Phosphorus 3.0 2.5 - 4.6 mg/dL  CBC WITH DIFFERENTIAL     Status: Abnormal   Collection Time: 11/06/20  3:41 AM  Result Value Ref Range   WBC 7.0 4.0 - 10.5 K/uL   RBC 3.38 (L) 3.87 - 5.11 MIL/uL   Hemoglobin 10.2 (L) 12.0 - 15.0 g/dL   HCT 16.131.1 (L) 09.636.0 - 04.546.0 %   MCV 92.0 80.0 - 100.0 fL   MCH 30.2  26.0 - 34.0 pg   MCHC 32.8 30.0 - 36.0 g/dL   RDW 40.913.1 81.111.5 - 91.415.5 %   Platelets 217 150 - 400 K/uL   nRBC 0.0 0.0 - 0.2 %   Neutrophils Relative % 62 %   Neutro Abs 4.3 1.7 - 7.7 K/uL   Lymphocytes Relative 20 %   Lymphs Abs 1.4 0.7 - 4.0 K/uL   Monocytes Relative 18 %   Monocytes Absolute 1.3 (H) 0.1 - 1.0 K/uL   Eosinophils Relative 0 %   Eosinophils Absolute 0.0 0.0 - 0.5 K/uL   Basophils Relative 0 %   Basophils Absolute 0.0 0.0 - 0.1 K/uL   Immature Granulocytes 0 %   Abs Immature Granulocytes 0.02 0.00 - 0.07 K/uL  TSH     Status: Abnormal   Collection Time: 11/06/20  3:41 AM  Result Value Ref Range   TSH 0.209 (L) 0.350 - 4.500 uIU/mL  Comprehensive metabolic panel     Status: Abnormal   Collection Time: 11/06/20  3:41 AM  Result Value Ref Range   Sodium 134 (L) 135 - 145 mmol/L   Potassium 3.2 (L) 3.5 - 5.1 mmol/L   Chloride 96 (L) 98 - 111 mmol/L   CO2 28 22 - 32 mmol/L   Glucose, Bld 112 (H) 70 - 99 mg/dL   BUN 15 8 - 23 mg/dL   Creatinine, Ser 7.820.79 0.44 - 1.00 mg/dL   Calcium 8.8 (L) 8.9 - 10.3 mg/dL   Total Protein 6.4 (L) 6.5 - 8.1 g/dL   Albumin 2.6 (L) 3.5 - 5.0 g/dL   AST 39 15 - 41 U/L   ALT 57 (H) 0 - 44 U/L   Alkaline Phosphatase 121 38 - 126 U/L   Total Bilirubin 0.5 0.3 - 1.2 mg/dL   GFR, Estimated >95>60 >62>60 mL/min   Anion gap 10 5 - 15   Recent Results (from the past 240 hour(s))  Culture, blood (routine x 2)     Status: None (Preliminary result)   Collection Time: 11/05/20  6:05 PM   Specimen: BLOOD  Result Value Ref Range Status   Specimen Description BLOOD LEFT ANTECUBITAL  Final   Special Requests   Final    BOTTLES DRAWN AEROBIC AND ANAEROBIC Blood Culture results may not be optimal due to an inadequate volume of blood received in culture bottles Performed at [facility], [address] [place], [place] [address]  Culture PENDING  Incomplete   Report Status PENDING  Incomplete  Culture, blood (routine x 2)     Status: None  (Preliminary result)   Collection Time: 11/05/20  6:05 PM   Specimen: BLOOD  Result Value Ref Range Status   Specimen Description BLOOD RIGHT ANTECUBITAL  Final   Special Requests   Final    BOTTLES DRAWN AEROBIC AND ANAEROBIC Blood Culture results may not be optimal due to an inadequate volume of blood received in culture bottles Performed at [facility], [address], [place], [place] [address]    Culture PENDING  Incomplete   Report Status PENDING  Incomplete  Resp Panel by RT-PCR (Flu A&B, Covid) Nasopharyngeal Swab     Status: None   Collection Time: 11/05/20  9:11 PM   Specimen: Nasopharyngeal Swab; Nasopharyngeal(NP) swabs in vial transport medium  Result Value Ref Range Status   SARS Coronavirus 2 by RT PCR NEGATIVE NEGATIVE Final    Comment: (NOTE) SARS-CoV-2 target nucleic acids are NOT DETECTED.  The SARS-CoV-2 RNA is generally detectable in upper respiratory specimens during the acute phase of infection. The lowest concentration of SARS-CoV-2 viral copies this assay can detect is 138 copies/mL. A negative result does not preclude SARS-Cov-2 infection and should not be used as the sole basis for treatment or other patient management decisions. A negative result may occur with  improper specimen collection/handling, submission of specimen other than nasopharyngeal swab, presence of viral mutation(s) within the areas targeted by this assay, and inadequate number of viral copies(<138 copies/mL). A negative result must be combined with clinical observations, patient history, and epidemiological information. The expected result is Negative.  Fact Sheet for Patients:  BloggerCourse.com  Fact Sheet for Healthcare Providers:  SeriousBroker.it  This test is no t yet approved or cleared by the [place] FDA and  has been authorized for detection and/or diagnosis of SARS-CoV-2 by FDA under an Emergency Use  Authorization (EUA). This EUA will remain  in effect (meaning this test can be used) for the duration of the COVID-19 declaration under Section 564(b)(1) of the Act, 21 U.S.C.section 360bbb-3(b)(1), unless the authorization is terminated  or revoked sooner.       Influenza A by PCR NEGATIVE NEGATIVE Final   Influenza B by PCR NEGATIVE NEGATIVE Final    Comment: (NOTE) The Xpert Xpress SARS-CoV-2/FLU/RSV plus assay is intended as an aid in the diagnosis of influenza from Nasopharyngeal swab specimens and should not be used as a sole basis for treatment. Nasal washings and aspirates are unacceptable for Xpert Xpress SARS-CoV-2/FLU/RSV testing.  Fact Sheet for Patients: BloggerCourse.com  Fact Sheet for Healthcare Providers: SeriousBroker.it  This test is not yet approved or cleared by the [place] FDA and has been authorized for detection and/or diagnosis of SARS-CoV-2 by FDA under an Emergency Use Authorization (EUA). This EUA will remain in effect (meaning this test can be used) for the duration of the COVID-19 declaration under Section 564(b)(1) of the Act, 21 U.S.C. section 360bbb-3(b)(1), unless the authorization is terminated or revoked.  Performed at [facility], [address], [place], [place] [address]     Renal Function: Recent Labs    11/05/20 1645 11/06/20 0341  CREATININE 1.09* 0.79   CrCl cannot be calculated (Unknown ideal weight.).  Radiologic Imaging: DG Chest Port 1 View  Result Date: 11/05/2020 CLINICAL DATA:  Fever of unknown source. EXAM: PORTABLE CHEST 1 VIEW COMPARISON:  Radiograph 06/19/2018 FINDINGS: The cardiomediastinal contours are normal.  The lungs are clear. Pulmonary vasculature is normal. No consolidation, pleural effusion, or pneumothorax. No acute osseous abnormalities are seen. Overlying artifact may be related to clothing. IMPRESSION: No acute chest findings or explanation  for fever. Electronically Signed   By: Narda Rutherford M.D.   On: 11/05/2020 19:33   CT RENAL STONE STUDY  Result Date: 11/05/2020 CLINICAL DATA:  Sepsis.  Flank pain. EXAM: CT ABDOMEN AND PELVIS WITHOUT CONTRAST TECHNIQUE: Multidetector CT imaging of the abdomen and pelvis was performed following the standard protocol without IV contrast. COMPARISON:  CT 06/19/2018 FINDINGS: Lower chest: The lung bases are clear. No pleural fluid or airspace opacity. Hepatobiliary: Diffusely decreased hepatic density consistent with steatosis. Mild focal fatty sparing adjacent to the gallbladder fossa. Enlarged liver spanning 20 cm cranial caudal. No evidence of focal hepatic abnormality on noncontrast exam. Gallbladder physiologically distended, no calcified stone. No biliary dilatation. Pancreas: No ductal dilatation or inflammation. Spleen: Normal in size without focal abnormality. Adrenals/Urinary Tract: Obstructing 10 x 12 mm stone in the left proximal ureter just beyond the ureteropelvic junction with moderate left hydroureteronephrosis. Mild left perinephric edema. Nonobstructing 7 mm stone in the lower left kidney. Stone in the right renal pelvis measures 1.9 x 1.2 cm. There is slight prominence of the renal pelvis but no calyceal dilatation. Punctate nonobstructing stone in the lower right kidney. No significant right perinephric edema. Small cyst in the posterior right renal cortex. Partially distended urinary bladder. Stomach/Bowel: Moderate to large volume of stool throughout the colon with colonic tortuosity, constipation pattern. Low lying cecum in the right pelvis. Appendectomy per history. No bowel obstruction or inflammation. Decompressed stomach. Vascular/Lymphatic: Normal caliber abdominal aorta. Mild atherosclerosis. Scattered small retroperitoneal lymph nodes are likely reactive. No enlarged lymph nodes in the abdomen or pelvis. Reproductive: Post hysterectomy.  No adnexal mass. Other: Tiny fat containing  umbilical hernia. No ascites, free air, or focal fluid collection. Musculoskeletal: Degenerative change throughout the lumbar spine. Anterolisthesis of L4 on L5 likely due to prominent facet hypertrophy. There are no acute or suspicious osseous abnormalities. IMPRESSION: 1. Obstructing 10 x 12 mm stone in the left proximal ureter just beyond the ureteropelvic junction with moderate left hydroureteronephrosis. 2. Large stone in the right renal pelvis with slight pelviectasis, but no frank hydronephrosis or inflammation. 3. Additional nonobstructing stones in both kidneys. 4. Hepatic steatosis and hepatomegaly. 5. Moderate to large volume of stool throughout the colon with colonic tortuosity, constipation pattern. Aortic Atherosclerosis (ICD10-I70.0). Electronically Signed   By: Narda Rutherford M.D.   On: 11/05/2020 23:08    I independently reviewed the above imaging studies.  Impression/Recommendation 65 year old female with sepsis secondary to an obstructing 12 mm left UPJ calculus as well as a 19 mm nonobstructing right renal stone.  -The risks, benefits and alternatives of cystoscopy with BILATERAL JJ stent placement was discussed with the patient.  Risks include, but are not limited to: bleeding, urinary tract infection, ureteral injury, ureteral stricture disease, chronic pain, urinary symptoms, bladder injury, stent migration, the need for nephrostomy tube placement, MI, CVA, DVT, PE and the inherent risks with general anesthesia.  The patient voices understanding and wishes to proceed.   Rhoderick Moody, MD [facility] 11/06/2020, 7:28 AM

## 2020-11-06 NOTE — Transfer of Care (Signed)
Immediate Anesthesia Transfer of Care Note  Patient: Patricia Davidson  Procedure(s) Performed: CYSTOSCOPY WITH URETEROSCOPY AND STENT PLACEMENT (Bilateral Ureter)  Patient Location: PACU  Anesthesia Type:General  Level of Consciousness: drowsy  Airway & Oxygen Therapy: Patient Spontanous Breathing and Patient connected to face mask oxygen  Post-op Assessment: Report given to RN and Post -op Vital signs reviewed and stable  Post vital signs: Reviewed and stable  Last Vitals:  Vitals Value Taken Time  BP 150/67 11/06/20 1127  Temp 36.4 C 11/06/20 1124  Pulse 62 11/06/20 1126  Resp 17 11/06/20 1126  SpO2 100 % 11/06/20 1126  Vitals shown include unvalidated device data.  Last Pain:  Vitals:   11/06/20 0914  TempSrc:   PainSc: 3       Patients Stated Pain Goal: 3 (11/06/20 0914)  Complications: No complications documented.

## 2020-11-06 NOTE — Discharge Summary (Addendum)
Physician Discharge Summary  Patricia Davidson WJX:[id number] DOB: 1955-11-02 DOA: 11/05/2020  PCP: Patient, No Pcp Per (Inactive)  Admit date: 11/05/2020 Discharge date: 11/06/2020  Admitted From: home Disposition:  home   Recommendations for Outpatient Follow-up:  1. F/u sodium and K in 2-3 wks  (on HCTZ)  Home Health:  none  Discharge Condition:  stable   CODE STATUS:  Full code   Diet recommendation:  Heart healthy Consultations:  Urology  Procedures/Studies: . Bilateral JJ stents w/o tethers   Discharge Diagnoses:  Principal Problem:   SIRS (systemic inflammatory response syndrome) (HCC) Active Problems:   Tick bite of abdomen   Ureteral stone   Hypokalemia   AKI (acute kidney injury) (HCC)   Hyperlipidemia   Essential hypertension   Elevated LFTs     Brief Summary: This is a 65 y/o female with HTH, HLD who presents for a fever with a T max of 104 at home associated with some confusion despite taking Doxycycline and Cipro. The fever has been present for about 1 wk. She was started on Cipro on Thursday by urgent care do to an abnormality on her urine dipstick. She later found a tick o her her that evening and the following day Doxycycline was started at urgent care. She was tested for COVID 3 x this week and was negative  In ED > CT > left hydroureteronephrosis with 10/12 mm obstructing stone- large stone in right renal pelvis with slight pelviectasis but no hydronephrosis Urology consulted and Ceftriaxone started.   Hospital Course:  Bilateral Nephrolithiasis- obstructing on the left - s/p b/l double J stents today - OK to dc home per Urology  Elevated Cr  - ? Dehydration - cr 1.09 in ED- improved to 0.79 after IVF and Antibiotics  Fever x 1 wk, mildly elevated LFTs - CRP 13.7 - COVID and Influenza negative - HIV neg - Hep panel negative - suspect she may have had pyelonephritis related to the stone and the urine may be sterile due to use of Cipro - no  further fevers after Ceftriaxone given in ED- T max 99.4 - I have spoken with Dr Liliane Shi and we have decided to give her a course of Keflex x 7 days  Tick bite - she can continue Doxycycline as previously recommended at urgent care - Ehrlichia and Lymes studies sent in ED- I will follow   HTN - cont home meds - have discussed her hyponatremia and hypokalemia noted here and her use of HCTZ- she states she has no h/o such lab abnormalities despite being on HCTZ for years.   Hypokalemia - replaced  Hyponatreamia - given IVF and improved  Discharge Exam: Vitals:   11/06/20 1224 11/06/20 1247  BP: (!) 144/70 (!) 144/73  Pulse: 68 74  Resp: 14 14  Temp: 97.8 F (36.6 C) 97.9 F (36.6 C)  SpO2: 99%    Vitals:   11/06/20 1154 11/06/20 1209 11/06/20 1224 11/06/20 1247  BP: (!) 146/74 (!) 149/75 (!) 144/70 (!) 144/73  Pulse: 63 76 68 74  Resp: Temp:   97.8 F (36.6 C) 97.9 F (36.6 C)  TempSrc:    Oral  SpO2: 100% 100% 99%   Height:        General: Pt is alert, awake, not in acute distress Cardiovascular: RRR, S1/S2 +, no rubs, no gallops Respiratory: CTA bilaterally, no wheezing, no rhonchi Abdominal: Soft, NT, ND, bowel sounds + Extremities: no edema, no cyanosis   Discharge  Instructions  Discharge Instructions    Diet - low sodium heart healthy   Complete by: As directed    Increase activity slowly   Complete by: As directed    No wound care   Complete by: As directed      Allergies as of 11/06/2020      Reactions   Sulfa Antibiotics Other (See Comments)   Childhood allergy (unsure of reaction)   Sulfamethoxazole Other (See Comments)   Unknown reaction as child      Medication List    STOP taking these medications   ciprofloxacin 500 MG tablet Commonly known as: CIPRO     TAKE these medications   amphetamine-dextroamphetamine 15 MG tablet Commonly known as: ADDERALL Take 15 mg by mouth daily as needed.   aspirin EC 81 MG tablet Take  81 mg by mouth at bedtime. Swallow whole.   atenolol 50 MG tablet Commonly known as: TENORMIN Take 50 mg by mouth at bedtime.   atorvastatin 40 MG tablet Commonly known as: LIPITOR Take 40 mg by mouth at bedtime.   cephALEXin 500 MG capsule Commonly known as: KEFLEX Take 1 capsule (500 mg total) by mouth 4 (four) times daily for 7 days.   doxycycline 100 MG capsule Commonly known as: VIBRAMYCIN Take 100 mg by mouth See admin instructions. Bid x 10 days   DULoxetine 30 MG capsule Commonly known as: CYMBALTA Take 90 mg by mouth at bedtime.   hydrochlorothiazide 12.5 MG capsule Commonly known as: MICROZIDE Take 12.5 mg by mouth at bedtime.   HYDROcodone-acetaminophen 10-325 MG tablet Commonly known as: NORCO Take 1 tablet by mouth every 6 (six) hours as needed (for breakthrough pain).   methocarbamol 500 MG tablet Commonly known as: ROBAXIN Take 1 tablet (500 mg total) by mouth 2 (two) times daily as needed for muscle spasms.   morphine 60 MG 12 hr tablet Commonly known as: MS CONTIN Take 120 mg by mouth at bedtime.   oxybutynin 5 MG tablet Commonly known as: DITROPAN Take 1 tablet (5 mg total) by mouth every 8 (eight) hours as needed for bladder spasms.   phenazopyridine 200 MG tablet Commonly known as: Pyridium Take 1 tablet (200 mg total) by mouth 3 (three) times daily as needed (for pain with urination).       Follow-up Information    Rene Paci, MD.   Specialty: Urology Why: My office will call to arrange your next surgery in 2 to 3 weeks Contact information: [address] [place] [address] [phone]              Allergies  Allergen Reactions  . Sulfa Antibiotics Other (See Comments)    Childhood allergy (unsure of reaction)  . Sulfamethoxazole Other (See Comments)    Unknown reaction as child      DG Retrograde Pyelogram  Result Date: 11/06/2020 CLINICAL DATA:  Bilateral retrograde pyelogram. Sepsis. Flank  pain. Bilateral nephrolithiasis. EXAM: RETROGRADE PYELOGRAM COMPARISON:  CT renal stone 11/05/2020 FINDINGS: Seven intraoperative fluoroscopic images were submitted for interpretation. The submitted images demonstrate opacification of what is likely the left ureter and what is likely the right ureter and renal collecting system. Filling defect noted at the ureteropelvic junction, likely calculi. Bilateral ureteral stents are noted. IMPRESSION: Bilateral retrograde pyelogram as above. Electronically Signed   By: Acquanetta Belling M.D.   On: 11/06/2020 12:25   DG Chest Port 1 View  Result Date: 11/05/2020 CLINICAL DATA:  Fever of unknown source. EXAM:  PORTABLE CHEST 1 VIEW COMPARISON:  Radiograph 06/19/2018 FINDINGS: The cardiomediastinal contours are normal. The lungs are clear. Pulmonary vasculature is normal. No consolidation, pleural effusion, or pneumothorax. No acute osseous abnormalities are seen. Overlying artifact may be related to clothing. IMPRESSION: No acute chest findings or explanation for fever. Electronically Signed   By: Narda Rutherford M.D.   On: 11/05/2020 19:33   CT RENAL STONE STUDY  Result Date: 11/05/2020 CLINICAL DATA:  Sepsis.  Flank pain. EXAM: CT ABDOMEN AND PELVIS WITHOUT CONTRAST TECHNIQUE: Multidetector CT imaging of the abdomen and pelvis was performed following the standard protocol without IV contrast. COMPARISON:  CT 06/19/2018 FINDINGS: Lower chest: The lung bases are clear. No pleural fluid or airspace opacity. Hepatobiliary: Diffusely decreased hepatic density consistent with steatosis. Mild focal fatty sparing adjacent to the gallbladder fossa. Enlarged liver spanning 20 cm cranial caudal. No evidence of focal hepatic abnormality on noncontrast exam. Gallbladder physiologically distended, no calcified stone. No biliary dilatation. Pancreas: No ductal dilatation or inflammation. Spleen: Normal in size without focal abnormality. Adrenals/Urinary Tract: Obstructing 10 x 12 mm  stone in the left proximal ureter just beyond the ureteropelvic junction with moderate left hydroureteronephrosis. Mild left perinephric edema. Nonobstructing 7 mm stone in the lower left kidney. Stone in the right renal pelvis measures 1.9 x 1.2 cm. There is slight prominence of the renal pelvis but no calyceal dilatation. Punctate nonobstructing stone in the lower right kidney. No significant right perinephric edema. Small cyst in the posterior right renal cortex. Partially distended urinary bladder. Stomach/Bowel: Moderate to large volume of stool throughout the colon with colonic tortuosity, constipation pattern. Low lying cecum in the right pelvis. Appendectomy per history. No bowel obstruction or inflammation. Decompressed stomach. Vascular/Lymphatic: Normal caliber abdominal aorta. Mild atherosclerosis. Scattered small retroperitoneal lymph nodes are likely reactive. No enlarged lymph nodes in the abdomen or pelvis. Reproductive: Post hysterectomy.  No adnexal mass. Other: Tiny fat containing umbilical hernia. No ascites, free air, or focal fluid collection. Musculoskeletal: Degenerative change throughout the lumbar spine. Anterolisthesis of L4 on L5 likely due to prominent facet hypertrophy. There are no acute or suspicious osseous abnormalities. IMPRESSION: 1. Obstructing 10 x 12 mm stone in the left proximal ureter just beyond the ureteropelvic junction with moderate left hydroureteronephrosis. 2. Large stone in the right renal pelvis with slight pelviectasis, but no frank hydronephrosis or inflammation. 3. Additional nonobstructing stones in both kidneys. 4. Hepatic steatosis and hepatomegaly. 5. Moderate to large volume of stool throughout the colon with colonic tortuosity, constipation pattern. Aortic Atherosclerosis (ICD10-I70.0). Electronically Signed   By: Narda Rutherford M.D.   On: 11/05/2020 23:08     The results of significant diagnostics from this hospitalization (including imaging,  microbiology, ancillary and laboratory) are listed below for reference.     Microbiology: Recent Results (from the past 240 hour(s))  Culture, blood (routine x 2)     Status: None (Preliminary result)   Collection Time: 11/05/20  6:05 PM   Specimen: BLOOD  Result Value Ref Range Status   Specimen Description BLOOD LEFT ANTECUBITAL  Final   Special Requests   Final    BOTTLES DRAWN AEROBIC AND ANAEROBIC Blood Culture results may not be optimal due to an inadequate volume of blood received in culture bottles   Culture   Final    NO GROWTH < 24 HOURS Performed at [facility], [address] [place], [place] [address]    Report Status PENDING  Incomplete  Culture, blood (routine x 2)  Status: None (Preliminary result)   Collection Time: 11/05/20  6:05 PM   Specimen: BLOOD  Result Value Ref Range Status   Specimen Description BLOOD RIGHT ANTECUBITAL  Final   Special Requests   Final    BOTTLES DRAWN AEROBIC AND ANAEROBIC Blood Culture results may not be optimal due to an inadequate volume of blood received in culture bottles   Culture   Final    NO GROWTH < 24 HOURS Performed at [facility], [address] [place], [place] [address]    Report Status PENDING  Incomplete  Resp Panel by RT-PCR (Flu A&B, Covid) Nasopharyngeal Swab     Status: None   Collection Time: 11/05/20  9:11 PM   Specimen: Nasopharyngeal Swab; Nasopharyngeal(NP) swabs in vial transport medium  Result Value Ref Range Status   SARS Coronavirus 2 by RT PCR NEGATIVE NEGATIVE Final    Comment: (NOTE) SARS-CoV-2 target nucleic acids are NOT DETECTED.  The SARS-CoV-2 RNA is generally detectable in upper respiratory specimens during the acute phase of infection. The lowest concentration of SARS-CoV-2 viral copies this assay can detect is 138 copies/mL. A negative result does not preclude SARS-Cov-2 infection and should not be used as the sole basis for treatment or other patient management decisions.  A negative result may occur with  improper specimen collection/handling, submission of specimen other than nasopharyngeal swab, presence of viral mutation(s) within the areas targeted by this assay, and inadequate number of viral copies(<138 copies/mL). A negative result must be combined with clinical observations, patient history, and epidemiological information. The expected result is Negative.  Fact Sheet for Patients:  BloggerCourse.com  Fact Sheet for Healthcare Providers:  SeriousBroker.it  This test is no t yet approved or cleared by the [place] FDA and  has been authorized for detection and/or diagnosis of SARS-CoV-2 by FDA under an Emergency Use Authorization (EUA). This EUA will remain  in effect (meaning this test can be used) for the duration of the COVID-19 declaration under Section 564(b)(1) of the Act, 21 U.S.C.section 360bbb-3(b)(1), unless the authorization is terminated  or revoked sooner.       Influenza A by PCR NEGATIVE NEGATIVE Final   Influenza B by PCR NEGATIVE NEGATIVE Final    Comment: (NOTE) The Xpert Xpress SARS-CoV-2/FLU/RSV plus assay is intended as an aid in the diagnosis of influenza from Nasopharyngeal swab specimens and should not be used as a sole basis for treatment. Nasal washings and aspirates are unacceptable for Xpert Xpress SARS-CoV-2/FLU/RSV testing.  Fact Sheet for Patients: BloggerCourse.com  Fact Sheet for Healthcare Providers: SeriousBroker.it  This test is not yet approved or cleared by the [place] FDA and has been authorized for detection and/or diagnosis of SARS-CoV-2 by FDA under an Emergency Use Authorization (EUA). This EUA will remain in effect (meaning this test can be used) for the duration of the COVID-19 declaration under Section 564(b)(1) of the Act, 21 U.S.C. section 360bbb-3(b)(1), unless the authorization  is terminated or revoked.  Performed at [facility], [address], [place], [place] [address]      Labs: BNP (last 3 results) No results for input(s): BNP in the last 8760 hours. Basic Metabolic Panel: Recent Labs  Lab 11/05/20 1645 11/06/20 0341  NA 133* 134*  K 3.2* 3.2*  CL 93* 96*  CO2 28 28  GLUCOSE 166* 112*  BUN 21 15  CREATININE 1.09* 0.79  CALCIUM 9.0 8.8*  MG  --  1.8  PHOS  --  3.0  Liver Function Tests: Recent Labs  Lab 11/05/20 1645 11/06/20 0341  AST 55* 39  ALT 71* 57*  ALKPHOS 131* 121  BILITOT 0.5 0.5  PROT 7.4 6.4*  ALBUMIN 3.1* 2.6*   No results for input(s): LIPASE, AMYLASE in the last 168 hours. No results for input(s): AMMONIA in the last 168 hours. CBC: Recent Labs  Lab 11/05/20 1645 11/06/20 0341  WBC 7.4 7.0  NEUTROABS  --  4.3  HGB 11.3* 10.2*  HCT 35.1* 31.1*  MCV 94.1 92.0  PLT 240 217   Cardiac Enzymes: Recent Labs  Lab 11/06/20 0341  CKTOTAL 23*   BNP: Invalid input(s): POCBNP CBG: Recent Labs  Lab 11/05/20 1554  GLUCAP 160*   D-Dimer No results for input(s): DDIMER in the last 72 hours. Hgb A1c No results for input(s): HGBA1C in the last 72 hours. Lipid Profile No results for input(s): CHOL, HDL, LDLCALC, TRIG, CHOLHDL, LDLDIRECT in the last 72 hours. Thyroid function studies Recent Labs    11/06/20 0341  TSH 0.209*   Anemia work up Recent Labs    11/06/20 0341  FERRITIN 320*   Urinalysis    Component Value Date/Time   COLORURINE YELLOW 11/05/2020 1702   APPEARANCEUR CLEAR 11/05/2020 1702   LABSPEC 1.021 11/05/2020 1702   PHURINE 5.0 11/05/2020 1702   GLUCOSEU NEGATIVE 11/05/2020 1702   HGBUR MODERATE (A) 11/05/2020 1702   BILIRUBINUR NEGATIVE 11/05/2020 1702   KETONESUR NEGATIVE 11/05/2020 1702   PROTEINUR 30 (A) 11/05/2020 1702   NITRITE NEGATIVE 11/05/2020 1702   LEUKOCYTESUR TRACE (A) 11/05/2020 1702   Sepsis Labs Invalid input(s): PROCALCITONIN,  WBC,   LACTICIDVEN Microbiology Recent Results (from the past 240 hour(s))  Culture, blood (routine x 2)     Status: None (Preliminary result)   Collection Time: 11/05/20  6:05 PM   Specimen: BLOOD  Result Value Ref Range Status   Specimen Description BLOOD LEFT ANTECUBITAL  Final   Special Requests   Final    BOTTLES DRAWN AEROBIC AND ANAEROBIC Blood Culture results may not be optimal due to an inadequate volume of blood received in culture bottles   Culture   Final    NO GROWTH < 24 HOURS Performed at [facility], [address] [place], [place] [address]    Report Status PENDING  Incomplete  Culture, blood (routine x 2)     Status: None (Preliminary result)   Collection Time: 11/05/20  6:05 PM   Specimen: BLOOD  Result Value Ref Range Status   Specimen Description BLOOD RIGHT ANTECUBITAL  Final   Special Requests   Final    BOTTLES DRAWN AEROBIC AND ANAEROBIC Blood Culture results may not be optimal due to an inadequate volume of blood received in culture bottles   Culture   Final    NO GROWTH < 24 HOURS Performed at [facility], [address] [place], [place] [address]    Report Status PENDING  Incomplete  Resp Panel by RT-PCR (Flu A&B, Covid) Nasopharyngeal Swab     Status: None   Collection Time: 11/05/20  9:11 PM   Specimen: Nasopharyngeal Swab; Nasopharyngeal(NP) swabs in vial transport medium  Result Value Ref Range Status   SARS Coronavirus 2 by RT PCR NEGATIVE NEGATIVE Final    Comment: (NOTE) SARS-CoV-2 target nucleic acids are NOT DETECTED.  The SARS-CoV-2 RNA is generally detectable in upper respiratory specimens during the acute phase of infection. The lowest concentration of SARS-CoV-2 viral copies this assay can detect  is 138 copies/mL. A negative result does not preclude SARS-Cov-2 infection and should not be used as the sole basis for treatment or other patient management decisions. A negative result may occur with  improper specimen  collection/handling, submission of specimen other than nasopharyngeal swab, presence of viral mutation(s) within the areas targeted by this assay, and inadequate number of viral copies(<138 copies/mL). A negative result must be combined with clinical observations, patient history, and epidemiological information. The expected result is Negative.  Fact Sheet for Patients:  BloggerCourse.com  Fact Sheet for Healthcare Providers:  SeriousBroker.it  This test is no t yet approved or cleared by the [place] FDA and  has been authorized for detection and/or diagnosis of SARS-CoV-2 by FDA under an Emergency Use Authorization (EUA). This EUA will remain  in effect (meaning this test can be used) for the duration of the COVID-19 declaration under Section 564(b)(1) of the Act, 21 U.S.C.section 360bbb-3(b)(1), unless the authorization is terminated  or revoked sooner.       Influenza A by PCR NEGATIVE NEGATIVE Final   Influenza B by PCR NEGATIVE NEGATIVE Final    Comment: (NOTE) The Xpert Xpress SARS-CoV-2/FLU/RSV plus assay is intended as an aid in the diagnosis of influenza from Nasopharyngeal swab specimens and should not be used as a sole basis for treatment. Nasal washings and aspirates are unacceptable for Xpert Xpress SARS-CoV-2/FLU/RSV testing.  Fact Sheet for Patients: BloggerCourse.com  Fact Sheet for Healthcare Providers: SeriousBroker.it  This test is not yet approved or cleared by the [place] FDA and has been authorized for detection and/or diagnosis of SARS-CoV-2 by FDA under an Emergency Use Authorization (EUA). This EUA will remain in effect (meaning this test can be used) for the duration of the COVID-19 declaration under Section 564(b)(1) of the Act, 21 U.S.C. section 360bbb-3(b)(1), unless the authorization is terminated or revoked.  Performed at [facility], [address], [place], [place] [address]      Time coordinating discharge in minutes: 65  SIGNED:   Calvert Cantor, MD  [facility] 11/06/2020, 2:07 PM

## 2020-11-06 NOTE — Anesthesia Preprocedure Evaluation (Addendum)
Anesthesia Evaluation  Patient identified by MRN, date of birth, ID band Patient awake    Reviewed: Allergy & Precautions, NPO status , Patient's Chart, lab work & pertinent test results, reviewed documented beta blocker date and time   History of Anesthesia Complications Negative for: history of anesthetic complications  Airway Mallampati: II  TM Distance: >3 FB Neck ROM: Full    Dental  (+) Teeth Intact, Dental Advisory Given   Pulmonary neg pulmonary ROS,    breath sounds clear to auscultation       Cardiovascular hypertension, Pt. on medications and Pt. on home beta blockers  Rhythm:Regular Rate:Normal     Neuro/Psych  Headaches, PSYCHIATRIC DISORDERS Depression    GI/Hepatic negative GI ROS, Neg liver ROS,   Endo/Other  negative endocrine ROS  Renal/GU Renal disease     Musculoskeletal  (+) Arthritis ,   Abdominal   Peds  Hematology negative hematology ROS (+) anemia ,   Anesthesia Other Findings   Reproductive/Obstetrics                           Anesthesia Physical Anesthesia Plan  ASA: II  Anesthesia Plan: General   Post-op Pain Management:    Induction: Intravenous  PONV Risk Score and Plan: 4 or greater and Ondansetron, Dexamethasone, Treatment may vary due to age or medical condition, Midazolam and Diphenhydramine  Airway Management Planned: LMA  Additional Equipment: None  Intra-op Plan:   Post-operative Plan: Extubation in OR  Informed Consent: I have reviewed the patients History and Physical, chart, labs and discussed the procedure including the risks, benefits and alternatives for the proposed anesthesia with the patient or authorized representative who has indicated his/her understanding and acceptance.     Dental advisory given  Plan Discussed with: CRNA  Anesthesia Plan Comments:        Anesthesia Quick Evaluation

## 2020-11-06 NOTE — Op Note (Signed)
Operative Note  Preoperative diagnosis:  1.  Obstructing 12 mm left UPJ calculus 2.  Nonobstructing 19 mm right renal calculus  Postoperative diagnosis: 1.  Same  Procedure(s): 1.  Cystoscopy with bilateral ureteral stent placement 2.  Bilateral retrograde pyelograms with intraoperative interpretation of fluoroscopic imaging  Surgeon: Rhoderick Moody, MD  Assistants:  None  Anesthesia:  General  Complications:  None  EBL: None  Specimens: 1.  None  Drains/Catheters: 1.  Bilateral 6 French, 24 cm JJ stents without tethers  Intraoperative findings:   1. Left retrograde pyelogram revealed a large filling defect within the proximal aspects of the left ureter, consistent with the stone seen on recent CT.  The left renal pelvis was only mildly dilated and exhibited no other filling defects. 2. Right retrograde pyelogram revealed a large filling defect consistent with the nonobstructing stone seen on recent CT within the right renal pelvis with no dilation.  There were no filling defects seen along the entirety of the right ureter.  Indication:  Patricia Davidson is a 65 y.o. female with an obstructing 12 mm left UPJ calculus along with a 19 mm nonobstructing right renal calculus.  She has been consented for the above procedures, voices understanding and wishes to proceed.  Description of procedure:  After informed consent was obtained, the patient was brought to the operating room and general LMA anesthesia was administered. The patient was then placed in the dorsolithotomy position and prepped and draped in the usual sterile fashion. A timeout was performed. A 23 French rigid cystoscope was then inserted into the urethral meatus and advanced into the bladder under direct vision. A complete bladder survey revealed no intravesical pathology.  A 5 French ureteral catheter was then inserted into the left ureteral orifice and a retrograde pyelogram was obtained, with the findings  listed above.  A Glidewire was then used to intubate the lumen of the ureteral catheter and was advanced up to the left renal pelvis, under fluoroscopic guidance.  The catheter was then removed, leaving the wire in place.  A 6 French, 24 cm JJ stent was then placed over the wire and into good position within the left collecting system, confirming placement via fluoroscopy.  A 5 French ureteral catheter was then inserted into the right ureteral orifice and a retrograde pyelogram was obtained, with the findings listed above.  A Glidewire was then used to intubate the lumen of the ureteral catheter and was advanced up to the right renal pelvis, under fluoroscopic guidance.  The catheter was then removed, leaving the wire in place.  A 6 French, 24 cm JJ stent was then placed over the wire and into good position within the right collecting system, confirming placement via fluoroscopy.  The patient's bladder was drained.  She tolerated the procedure well and was transferred to the postanesthesia in stable condition.  Plan:  I will arrange outpatient follow-up for definitive stone treatment in the next 2 to 3 weeks.  Ok for discharge from a GU standpoint.

## 2020-11-06 NOTE — Progress Notes (Signed)
Dr. Renold Don stated they will give beta blocker in O.R.

## 2020-11-06 NOTE — Anesthesia Procedure Notes (Signed)
Procedure Name: LMA Insertion Date/Time: 11/06/2020 10:41 AM Performed by: Tressia Miners, CRNA Pre-anesthesia Checklist: Patient identified, Emergency Drugs available, Suction available and Patient being monitored Patient Re-evaluated:Patient Re-evaluated prior to induction Oxygen Delivery Method: Circle System Utilized Preoxygenation: Pre-oxygenation with 100% oxygen Induction Type: IV induction Ventilation: Mask ventilation without difficulty LMA: LMA inserted LMA Size: 4.0 Number of attempts: 1 Airway Equipment and Method: Bite block Placement Confirmation: positive ETCO2 Tube secured with: Tape Dental Injury: Teeth and Oropharynx as per pre-operative assessment

## 2020-11-06 NOTE — Plan of Care (Signed)
  Problem: Education: Goal: Knowledge of General Education information will improve Description: Including pain rating scale, medication(s)/side effects and non-pharmacologic comfort measures Outcome: Adequate for Discharge   

## 2020-11-07 ENCOUNTER — Encounter (HOSPITAL_COMMUNITY): Payer: Self-pay | Admitting: Urology

## 2020-11-07 LAB — URINE CULTURE: Culture: NO GROWTH

## 2020-11-10 LAB — CULTURE, BLOOD (ROUTINE X 2)
Culture: NO GROWTH
Culture: NO GROWTH

## 2020-11-15 ENCOUNTER — Other Ambulatory Visit: Payer: Self-pay | Admitting: Urology

## 2020-11-25 ENCOUNTER — Other Ambulatory Visit: Payer: Self-pay

## 2020-11-25 ENCOUNTER — Encounter (HOSPITAL_BASED_OUTPATIENT_CLINIC_OR_DEPARTMENT_OTHER): Payer: Self-pay | Admitting: Urology

## 2020-11-25 NOTE — Progress Notes (Signed)
Spoke w/ via phone for pre-op interview---pt Lab needs dos----     I stat, ekg          Lab results------chest xray 1 view 10-26-2020 epic COVID test -----patient states asymptomatic no test needed Arrive at -------1100 am 11-30-2020 NPO after MN NO Solid Food.  Clear liquids from MN until---1000 am then npo Med rec completed Medications to take morning of surgery -----hydrocodone Diabetic medication -----n/a Patient instructed no nail polish to be worn day of surgery Patient instructed to bring photo id and insurance card day of surgery Patient aware to have Driver (ride ) / caregiver  Patricia Davidson significant other will stay   for 24 hours after surgery  Patient Special Instructions -----bring cpap mask tubing and machine and leave in car Pre-Op special Istructions -----none Patient verbalized understanding of instructions that were given at this phone interview. Patient denies shortness of breath, chest pain, fever, cough at this phone interview.

## 2020-11-30 ENCOUNTER — Ambulatory Visit (HOSPITAL_BASED_OUTPATIENT_CLINIC_OR_DEPARTMENT_OTHER): Payer: BC Managed Care – PPO | Admitting: Certified Registered"

## 2020-11-30 ENCOUNTER — Ambulatory Visit (HOSPITAL_BASED_OUTPATIENT_CLINIC_OR_DEPARTMENT_OTHER)
Admission: RE | Admit: 2020-11-30 | Discharge: 2020-11-30 | Disposition: A | Discharge status: Home / Self Care | Payer: BC Managed Care – PPO | Attending: Urology | Admitting: Urology

## 2020-11-30 ENCOUNTER — Encounter (HOSPITAL_BASED_OUTPATIENT_CLINIC_OR_DEPARTMENT_OTHER)
Admission: RE | Disposition: A | Discharge status: Home / Self Care | Payer: Self-pay | Source: Home / Self Care | Attending: Urology

## 2020-11-30 ENCOUNTER — Encounter (HOSPITAL_BASED_OUTPATIENT_CLINIC_OR_DEPARTMENT_OTHER): Payer: Self-pay | Admitting: Urology

## 2020-11-30 DIAGNOSIS — Z882 Allergy status to sulfonamides status: Secondary | ICD-10-CM | POA: Insufficient documentation

## 2020-11-30 DIAGNOSIS — Z96653 Presence of artificial knee joint, bilateral: Secondary | ICD-10-CM | POA: Diagnosis not present

## 2020-11-30 DIAGNOSIS — Z8616 Personal history of COVID-19: Secondary | ICD-10-CM | POA: Insufficient documentation

## 2020-11-30 DIAGNOSIS — N202 Calculus of kidney with calculus of ureter: Secondary | ICD-10-CM | POA: Insufficient documentation

## 2020-11-30 DIAGNOSIS — Z87442 Personal history of urinary calculi: Secondary | ICD-10-CM | POA: Insufficient documentation

## 2020-11-30 HISTORY — DX: Presence of spectacles and contact lenses: Z97.3

## 2020-11-30 HISTORY — DX: Hyperlipidemia, unspecified: E78.5

## 2020-11-30 HISTORY — DX: COVID-19: U07.1

## 2020-11-30 HISTORY — DX: Urgency of urination: R39.15

## 2020-11-30 HISTORY — DX: Personal history of urinary calculi: Z87.442

## 2020-11-30 HISTORY — DX: Sleep apnea, unspecified: G47.30

## 2020-11-30 HISTORY — DX: Cardiac arrhythmia, unspecified: I49.9

## 2020-11-30 HISTORY — DX: Systemic inflammatory response syndrome (sirs) of non-infectious origin without acute organ dysfunction: R65.10

## 2020-11-30 HISTORY — PX: CYSTOSCOPY/URETEROSCOPY/HOLMIUM LASER/STENT PLACEMENT: SHX6546

## 2020-11-30 LAB — POCT I-STAT, CHEM 8
BUN: 18 mg/dL (ref 8–23)
Calcium, Ion: 1.23 mmol/L (ref 1.15–1.40)
Chloride: 98 mmol/L (ref 98–111)
Creatinine, Ser: 0.5 mg/dL (ref 0.44–1.00)
Glucose, Bld: 104 mg/dL — ABNORMAL HIGH (ref 70–99)
HCT: 34 % — ABNORMAL LOW (ref 36.0–46.0)
Hemoglobin: 11.6 g/dL — ABNORMAL LOW (ref 12.0–15.0)
Potassium: 3.6 mmol/L (ref 3.5–5.1)
Sodium: 140 mmol/L (ref 135–145)
TCO2: 30 mmol/L (ref 22–32)

## 2020-11-30 SURGERY — CYSTOSCOPY/URETEROSCOPY/HOLMIUM LASER/STENT PLACEMENT
Anesthesia: General | Site: Ureter | Laterality: Bilateral

## 2020-11-30 MED ORDER — SODIUM CHLORIDE 0.9 % IR SOLN
Status: DC | PRN
  Administered 2020-11-30: 5000 mL

## 2020-11-30 MED ORDER — KETOROLAC TROMETHAMINE 30 MG/ML IJ SOLN
INTRAMUSCULAR | Status: AC
  Filled 2020-11-30: qty 1

## 2020-11-30 MED ORDER — ONDANSETRON HCL 4 MG/2ML IJ SOLN
INTRAMUSCULAR | Status: AC
  Filled 2020-11-30: qty 2

## 2020-11-30 MED ORDER — PROPOFOL 10 MG/ML IV BOLUS
INTRAVENOUS | Status: DC | PRN
  Administered 2020-11-30: 170 mg via INTRAVENOUS

## 2020-11-30 MED ORDER — PHENYLEPHRINE HCL-NACL 10-0.9 MG/250ML-% IV SOLN
INTRAVENOUS | Status: AC
  Filled 2020-11-30: qty 500

## 2020-11-30 MED ORDER — MIDAZOLAM HCL 2 MG/2ML IJ SOLN
INTRAMUSCULAR | Status: DC | PRN
  Administered 2020-11-30: 2 mg via INTRAVENOUS

## 2020-11-30 MED ORDER — MIDAZOLAM HCL 2 MG/2ML IJ SOLN
INTRAMUSCULAR | Status: AC
  Filled 2020-11-30: qty 2

## 2020-11-30 MED ORDER — OXYBUTYNIN CHLORIDE 5 MG PO TABS: 5.0000 mg | ORAL_TABLET | Freq: Three times a day (TID) | ORAL | 1 refills | Status: DC | PRN

## 2020-11-30 MED ORDER — LACTATED RINGERS IV SOLN: INTRAVENOUS | Status: DC

## 2020-11-30 MED ORDER — FENTANYL CITRATE (PF) 100 MCG/2ML IJ SOLN
INTRAMUSCULAR | Status: DC | PRN
  Administered 2020-11-30: 50 ug via INTRAVENOUS
  Administered 2020-11-30: 25 ug via INTRAVENOUS
  Administered 2020-11-30: 50 ug via INTRAVENOUS

## 2020-11-30 MED ORDER — DEXAMETHASONE SODIUM PHOSPHATE 10 MG/ML IJ SOLN
INTRAMUSCULAR | Status: DC | PRN
  Administered 2020-11-30: 5 mg via INTRAVENOUS

## 2020-11-30 MED ORDER — CIPROFLOXACIN IN D5W 400 MG/200ML IV SOLN
INTRAVENOUS | Status: AC
  Filled 2020-11-30: qty 200

## 2020-11-30 MED ORDER — FENTANYL CITRATE (PF) 100 MCG/2ML IJ SOLN: 25.0000 ug | INTRAMUSCULAR | Status: DC | PRN

## 2020-11-30 MED ORDER — CIPROFLOXACIN IN D5W 400 MG/200ML IV SOLN
400.0000 mg | INTRAVENOUS | Status: AC
  Administered 2020-11-30: 400 mg via INTRAVENOUS

## 2020-11-30 MED ORDER — ONDANSETRON HCL 4 MG/2ML IJ SOLN
INTRAMUSCULAR | Status: DC | PRN
  Administered 2020-11-30: 4 mg via INTRAVENOUS

## 2020-11-30 MED ORDER — FENTANYL CITRATE (PF) 100 MCG/2ML IJ SOLN
INTRAMUSCULAR | Status: AC
  Filled 2020-11-30: qty 2

## 2020-11-30 MED ORDER — DEXAMETHASONE SODIUM PHOSPHATE 10 MG/ML IJ SOLN
INTRAMUSCULAR | Status: AC
  Filled 2020-11-30: qty 1

## 2020-11-30 MED ORDER — KETOROLAC TROMETHAMINE 30 MG/ML IJ SOLN
INTRAMUSCULAR | Status: DC | PRN
  Administered 2020-11-30: 30 mg via INTRAVENOUS

## 2020-11-30 MED ORDER — CEPHALEXIN 500 MG PO CAPS: 500.0000 mg | ORAL_CAPSULE | Freq: Two times a day (BID) | ORAL | 0 refills | Status: AC

## 2020-11-30 MED ORDER — LIDOCAINE 2% (20 MG/ML) 5 ML SYRINGE
INTRAMUSCULAR | Status: DC | PRN
  Administered 2020-11-30: 100 mg via INTRAVENOUS

## 2020-11-30 MED ORDER — LIDOCAINE HCL (PF) 2 % IJ SOLN
INTRAMUSCULAR | Status: AC
  Filled 2020-11-30: qty 5

## 2020-11-30 SURGICAL SUPPLY — 31 items
APL SKNCLS STERI-STRIP NONHPOA (GAUZE/BANDAGES/DRESSINGS)
BAG DRAIN URO-CYSTO SKYTR STRL (DRAIN) ×3 IMPLANT
BAG DRN UROCATH (DRAIN) ×1
BASKET STONE 1.7 NGAGE (UROLOGICAL SUPPLIES) IMPLANT
BASKET ZERO TIP NITINOL 2.4FR (BASKET) IMPLANT
BENZOIN TINCTURE PRP APPL 2/3 (GAUZE/BANDAGES/DRESSINGS) IMPLANT
BSKT STON RTRVL ZERO TP 2.4FR (BASKET)
CATH URET 5FR 28IN OPEN ENDED (CATHETERS) ×3 IMPLANT
CLOSURE WOUND 1/2 X4 (GAUZE/BANDAGES/DRESSINGS)
CLOTH BEACON ORANGE TIMEOUT ST (SAFETY) ×3 IMPLANT
COVER DOME SNAP 22 D (MISCELLANEOUS) ×3 IMPLANT
FIBER LASER FLEXIVA 365 (UROLOGICAL SUPPLIES) IMPLANT
GLOVE SURG ENC MOIS LTX SZ7.5 (GLOVE) ×3 IMPLANT
GOWN STRL REUS W/TWL XL LVL3 (GOWN DISPOSABLE) ×3 IMPLANT
GUIDEWIRE STR DUAL SENSOR (WIRE) ×6 IMPLANT
GUIDEWIRE ZIPWRE .038 STRAIGHT (WIRE) ×3 IMPLANT
IV NS IRRIG 3000ML ARTHROMATIC (IV SOLUTION) ×6 IMPLANT
KIT TURNOVER CYSTO (KITS) ×3 IMPLANT
MANIFOLD NEPTUNE II (INSTRUMENTS) ×3 IMPLANT
NS IRRIG 1000ML POUR BTL (IV SOLUTION) ×3 IMPLANT
NS IRRIG 500ML POUR BTL (IV SOLUTION) ×3 IMPLANT
PACK CYSTO (CUSTOM PROCEDURE TRAY) ×3 IMPLANT
SHEATH URETERAL 12FRX35CM (MISCELLANEOUS) ×3 IMPLANT
STENT URET 6FRX24 CONTOUR (STENTS) ×6 IMPLANT
STRIP CLOSURE SKIN 1/2X4 (GAUZE/BANDAGES/DRESSINGS) IMPLANT
SYR 10ML LL (SYRINGE) ×3 IMPLANT
TRACTIP FLEXIVA PULS ID 200XHI (Laser) ×2 IMPLANT
TRACTIP FLEXIVA PULSE ID 200 (Laser) ×6
TUBE CONNECTING 12'X1/4 (SUCTIONS) ×1
TUBE CONNECTING 12X1/4 (SUCTIONS) ×2 IMPLANT
TUBING UROLOGY SET (TUBING) ×3 IMPLANT
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
## 2020-11-30 NOTE — Discharge Instructions (Signed)
[facility] [phone] Post Ureteroscopy With or Without Stent Instructions  Definitions:  Ureter: The duct that transports urine from the kidney to the bladder. Stent:   A plastic hollow tube that is placed into the ureter, from the kidney to the bladder to prevent the ureter from swelling shut.  GENERAL INSTRUCTIONS:  Despite the fact that no skin incisions were used, the area around the ureter and bladder is raw and irritated. The stent is a foreign body which will further irritate the bladder wall. This irritation is manifested by increased frequency of urination, both day and night, and by an increase in the urge to urinate. In some, the urge to urinate is present almost always. Sometimes the urge is strong enough that you may not be able to stop yourself from urinating. The only real cure is to remove the stent and then give time for the bladder wall to heal which can't be done until the danger of the ureter swelling shut has passed, which varies.  You may see some blood in your urine while the stent is in place and a few days afterwards. Do not be alarmed, even if the urine was clear for a while. Get off your feet and drink lots of fluids until clearing occurs. If you start to pass clots or don't improve, call us.  DIET: You may return to your normal diet immediately. Because of the raw surface of your bladder, alcohol, spicy foods, acid type foods and drinks with caffeine may cause irritation or frequency and should be used in moderation. To keep your urine flowing freely and to avoid constipation, drink plenty of fluids during the day ( 8-10 glasses ). Tip: Avoid cranberry juice because it is very acidic.  ACTIVITY: Your physical activity doesn't need to be restricted. However, if you are very active, you may see some blood in your urine. We suggest that you reduce your activity under these circumstances until the bleeding has stopped.  BOWELS: It is important to  keep your bowels regular during the postoperative period. Straining with bowel movements can cause bleeding. A bowel movement every other day is reasonable. Use a mild laxative if needed, such as Milk of Magnesia 2-3 tablespoons, or 2 Dulcolax tablets. Call if you continue to have problems. If you have been taking narcotics for pain, before, during or after your surgery, you may be constipated. Take a laxative if necessary.   MEDICATION: You should resume your pre-surgery medications unless told not to. In addition you will often be given an antibiotic to prevent infection. These should be taken as prescribed until the bottles are finished unless you are having an unusual reaction to one of the drugs.  PROBLEMS YOU SHOULD REPORT TO US:  Fevers over 100.5 Fahrenheit.  Heavy bleeding, or clots ( See above notes about blood in urine ).  Inability to urinate.  Drug reactions ( hives, rash, nausea, vomiting, diarrhea ).  Severe burning or pain with urination that is not improving.  FOLLOW-UP: You will need a follow-up appointment to monitor your progress. Call for this appointment at the number listed above. Usually the first appointment will be about three to fourteen days after your surgery.   Post Anesthesia Home Care Instructions  Activity: Get plenty of rest for the remainder of the day. A responsible individual must stay with you for 24 hours following the procedure.  For the next 24 hours, DO NOT: -Drive a car -Operate machinery -Drink alcoholic beverages -Take any medication unless   instructed by your physician -Make any legal decisions or sign important papers.  Meals: Start with liquid foods such as gelatin or soup. Progress to regular foods as tolerated. Avoid greasy, spicy, heavy foods. If nausea and/or vomiting occur, drink only clear liquids until the nausea and/or vomiting subsides. Call your physician if vomiting continues.  Special Instructions/Symptoms: Your throat  may feel dry or sore from the anesthesia or the breathing tube placed in your throat during surgery. If this causes discomfort, gargle with warm salt water. The discomfort should disappear within 24 hours.  No ibuprofen, Advil, Aleve, Motrin, or naproxen until after 9:15 pm today if needed.  

## 2020-11-30 NOTE — Anesthesia Postprocedure Evaluation (Signed)
Anesthesia Post Note  Patient: Patricia Davidson  Procedure(s) Performed: CYSTOSCOPY/URETEROSCOPY/HOLMIUM LASER/STENT EXCHANGE (Bilateral Ureter)     Patient location during evaluation: PACU Anesthesia Type: General Level of consciousness: awake Pain management: pain level controlled Vital Signs Assessment: post-procedure vital signs reviewed and stable Respiratory status: spontaneous breathing Cardiovascular status: stable Postop Assessment: no apparent nausea or vomiting Anesthetic complications: no   No complications documented.  Last Vitals:  Vitals:   11/30/20 1545 11/30/20 1600  BP: 131/67   Pulse: 64 74  Resp: 10 (!) 9  Temp:  36.4 C  SpO2: 93% 92%    Last Pain:  Vitals:   11/30/20 1600  TempSrc:   PainSc: 0-No pain                 Dajanique Robley

## 2020-11-30 NOTE — Op Note (Signed)
Operative Note  Preoperative diagnosis:  1.  12 mm left UPJ and 1 cm left renal stone 2.  19 mm right renal stone  Postoperative diagnosis: 1.  12 mm left UPJ and 1 cm left renal stone 2.  19 mm right renal stone  Procedure(s): 1.  Cystoscopy with bilateral ureteroscopy, bilateral holmium laser lithotripsy and bilateral JJ stent exchange  Surgeon: Rhoderick Moody, MD  Assistants:  None  Anesthesia:  General  Complications:  None  EBL: Less than 5 mL  Specimens: 1.  Previously placed bilateral ureteral stents were removed intact, inspected and discarded  Drains/Catheters: 1.  Bilateral 6 French, 24 cm JJ stents without tethers  Intraoperative findings:   1. Good fragmentation of bilateral ureteral and renal stone  Indication:  Patricia Davidson is a 65 y.o. female who presented to the emergency department on 11/06/2020 with worsening left-sided flank pain.  She was found to have an obstructing 12 mm left UPJ calculus as well as a 19 mm right renal calculus.  She underwent bilateral ureteral stent placement on 11/06/2020.  She is here today for definitive stone treatment.  She has been consented for the above procedures, voices understanding and wishes to proceed.  Description of procedure:  After informed consent was obtained, the patient was brought to the operating room and general LMA anesthesia was administered. The patient was then placed in the dorsolithotomy position and prepped and draped in the usual sterile fashion. A timeout was performed. A 23 French rigid cystoscope was then inserted into the urethral meatus and advanced into the bladder under direct vision. A complete bladder survey revealed no intravesical pathology.  Her previously placed left JJ stent was then grasped at its distal curl and retracted to the urethral meatus.  A Glidewire was then advanced up the lumen of the stent to the left renal pelvis, or fluoroscopic guidance.  The stent was then removed,  inspected and discarded.  An additional sensor wire was then advanced up the left ureter to the left renal pelvis.  A 12/14 French ureteral access sheath was then advanced over the sensor wire and into good position within the proximal aspects of the left ureter.  A flexible ureteroscope was then advanced through the lumen of the access sheath and into the left renal pelvis.  A 200 m holmium laser was then used to dust her multiple large stones into 2 mm or less fragments.  The ureteral access sheath was then removed under direct vision revealing no evidence of ureteral trauma.  A 6 French, 24 cm JJ stent was then advanced over the Glidewire and into good position within the left collecting system, confirming placement via fluoroscopy.  Her right ureteral stent was then grasped at its distal curl and retracted to the urethral meatus.  A Glidewire was then advanced through the lumen of the stent up to the right renal pelvis, fluoroscopic guidance.  Stent was then removed intact, inspected and discarded.  An additional sensor wire was then advanced up the right ureter to the renal pelvis, or fluoroscopic guidance.  A 12/14 French ureteral access sheath was then advanced over the sensor wire and into good position within the proximal aspects of the right ureter, under fluoroscopic guidance.  A flexible ureteroscope was then advanced to the lumen of the ureteral access sheath and her large renal stone was identified.  A 200 m holmium laser was then used to dust the stone into 2 mm or less fragments.  The ureteral access sheath  was then removed under direct vision revealing no evidence of ureteral trauma.  A 6 French, 24 cm JJ stent was then advanced over the Glidewire and into good position within the left collecting system, confirming placement via fluoroscopy.   Plan: Follow-up in 1 week for KUB and possible stent removal

## 2020-11-30 NOTE — Anesthesia Procedure Notes (Signed)
Procedure Name: LMA Insertion Date/Time: 11/30/2020 1:46 PM Performed by: Francie Massing, CRNA Pre-anesthesia Checklist: Patient identified, Emergency Drugs available, Suction available and Patient being monitored Patient Re-evaluated:Patient Re-evaluated prior to induction Oxygen Delivery Method: Circle system utilized Preoxygenation: Pre-oxygenation with 100% oxygen Induction Type: IV induction Ventilation: Mask ventilation without difficulty LMA: LMA inserted LMA Size: 4.0 Number of attempts: 1 Airway Equipment and Method: Bite block Placement Confirmation: positive ETCO2 Tube secured with: Tape Dental Injury: Teeth and Oropharynx as per pre-operative assessment

## 2020-11-30 NOTE — Transfer of Care (Signed)
Immediate Anesthesia Transfer of Care Note  Patient: Patricia Davidson  Procedure(s) Performed: Procedure(s) (LRB): CYSTOSCOPY/URETEROSCOPY/HOLMIUM LASER/STENT EXCHANGE (Bilateral)  Patient Location: PACU  Anesthesia Type: General  Level of Consciousness: awake, oriented, sedated and patient cooperative  Airway & Oxygen Therapy: Patient Spontanous Breathing and Patient connected to face mask oxygen  Post-op Assessment: Report given to PACU RN and Post -op Vital signs reviewed and stable  Post vital signs: Reviewed and stable  Complications: No apparent anesthesia complications Vitals Value Taken Time  BP 150/74 11/30/20 1524  Temp 36.4 C 11/30/20 1524  Pulse 66 11/30/20 1527  Resp 9 11/30/20 1527  SpO2 99 % 11/30/20 1527  Vitals shown include unvalidated device data.  Last Pain:  Vitals:   11/30/20 1119  TempSrc: Oral  PainSc: 0-No pain      Patients Stated Pain Goal: 7 (11/30/20 1119)  Complications: No complications documented.

## 2020-11-30 NOTE — Anesthesia Preprocedure Evaluation (Signed)
Anesthesia Evaluation  Patient identified by MRN, date of birth, ID band Patient awake    Reviewed: Allergy & Precautions, NPO status , Patient's Chart, lab work & pertinent test results  Airway Mallampati: II  TM Distance: >3 FB     Dental   Pulmonary sleep apnea ,    breath sounds clear to auscultation       Cardiovascular hypertension, + dysrhythmias  Rhythm:Regular Rate:Normal     Neuro/Psych negative neurological ROS     GI/Hepatic Neg liver ROS,   Endo/Other    Renal/GU Renal disease     Musculoskeletal   Abdominal   Peds  Hematology  (+) anemia ,   Anesthesia Other Findings   Reproductive/Obstetrics                             Anesthesia Physical Anesthesia Plan  ASA: III  Anesthesia Plan: General   Post-op Pain Management:    Induction: Intravenous  PONV Risk Score and Plan: 3 and Ondansetron, Dexamethasone and Midazolam  Airway Management Planned: Oral ETT  Additional Equipment:   Intra-op Plan:   Post-operative Plan: Extubation in OR  Informed Consent: I have reviewed the patients History and Physical, chart, labs and discussed the procedure including the risks, benefits and alternatives for the proposed anesthesia with the patient or authorized representative who has indicated his/her understanding and acceptance.     Dental advisory given  Plan Discussed with: CRNA and Anesthesiologist  Anesthesia Plan Comments:         Anesthesia Quick Evaluation

## 2020-12-01 ENCOUNTER — Encounter (HOSPITAL_BASED_OUTPATIENT_CLINIC_OR_DEPARTMENT_OTHER): Payer: Self-pay | Admitting: Urology

## 2022-01-01 ENCOUNTER — Ambulatory Visit (INDEPENDENT_AMBULATORY_CARE_PROVIDER_SITE_OTHER): Payer: Medicare Other | Admitting: Emergency Medicine

## 2022-01-01 ENCOUNTER — Encounter: Payer: Self-pay | Admitting: Emergency Medicine

## 2022-01-01 VITALS — BP 136/82 | HR 76 | Temp 98.2°F | Ht 72.0 in | Wt 264.2 lb

## 2022-01-01 DIAGNOSIS — G4733 Obstructive sleep apnea (adult) (pediatric): Secondary | ICD-10-CM

## 2022-01-01 DIAGNOSIS — Z1231 Encounter for screening mammogram for malignant neoplasm of breast: Secondary | ICD-10-CM

## 2022-01-01 DIAGNOSIS — Z23 Encounter for immunization: Secondary | ICD-10-CM | POA: Diagnosis not present

## 2022-01-01 DIAGNOSIS — Z9989 Dependence on other enabling machines and devices: Secondary | ICD-10-CM

## 2022-01-01 DIAGNOSIS — R002 Palpitations: Secondary | ICD-10-CM | POA: Insufficient documentation

## 2022-01-01 DIAGNOSIS — Z7689 Persons encountering health services in other specified circumstances: Secondary | ICD-10-CM | POA: Diagnosis not present

## 2022-01-01 DIAGNOSIS — I10 Essential (primary) hypertension: Secondary | ICD-10-CM | POA: Diagnosis not present

## 2022-01-01 DIAGNOSIS — R5383 Other fatigue: Secondary | ICD-10-CM | POA: Insufficient documentation

## 2022-01-01 DIAGNOSIS — G894 Chronic pain syndrome: Secondary | ICD-10-CM

## 2022-01-01 DIAGNOSIS — E785 Hyperlipidemia, unspecified: Secondary | ICD-10-CM

## 2022-01-01 LAB — COMPREHENSIVE METABOLIC PANEL
ALT: 18 U/L (ref 0–35)
AST: 19 U/L (ref 0–37)
Albumin: 4.3 g/dL (ref 3.5–5.2)
Alkaline Phosphatase: 116 U/L (ref 39–117)
BUN: 16 mg/dL (ref 6–23)
CO2: 34 mEq/L — ABNORMAL HIGH (ref 19–32)
Calcium: 9.7 mg/dL (ref 8.4–10.5)
Chloride: 100 mEq/L (ref 96–112)
Creatinine, Ser: 0.66 mg/dL (ref 0.40–1.20)
GFR: 92.1 mL/min (ref >60.00)
Glucose, Bld: 104 mg/dL — ABNORMAL HIGH (ref 70–99)
Potassium: 3.8 mEq/L (ref 3.5–5.1)
Sodium: 140 mEq/L (ref 135–145)
Total Bilirubin: 0.3 mg/dL (ref 0.2–1.2)
Total Protein: 7.9 g/dL (ref 6.0–8.3)

## 2022-01-01 LAB — CBC WITH DIFFERENTIAL/PLATELET
Basophils Absolute: 0.1 10*3/uL (ref 0.0–0.1)
Basophils Relative: 0.8 % (ref 0.0–3.0)
Eosinophils Absolute: 0.1 10*3/uL (ref 0.0–0.7)
Eosinophils Relative: 2.5 % (ref 0.0–5.0)
HCT: 35.7 % — ABNORMAL LOW (ref 36.0–46.0)
Hemoglobin: 11.8 g/dL — ABNORMAL LOW (ref 12.0–15.0)
Lymphocytes Relative: 34.5 % (ref 12.0–46.0)
Lymphs Abs: 2.1 10*3/uL (ref 0.7–4.0)
MCHC: 33 g/dL (ref 30.0–36.0)
MCV: 92.3 fl (ref 78.0–100.0)
Monocytes Absolute: 0.7 10*3/uL (ref 0.1–1.0)
Monocytes Relative: 10.9 % (ref 3.0–12.0)
Neutro Abs: 3.1 10*3/uL (ref 1.4–7.7)
Neutrophils Relative %: 51.3 % (ref 43.0–77.0)
Platelets: 301 10*3/uL (ref 150.0–400.0)
RBC: 3.86 Mil/uL — ABNORMAL LOW (ref 3.87–5.11)
RDW: 13.8 % (ref 11.5–15.5)
WBC: 6 10*3/uL (ref 4.0–10.5)

## 2022-01-01 LAB — LIPID PANEL
Cholesterol: 163 mg/dL (ref 0–200)
HDL: 52.2 mg/dL (ref >39.00)
LDL Cholesterol: 87 mg/dL (ref 0–99)
NonHDL: 110.31
Total CHOL/HDL Ratio: 3
Triglycerides: 116 mg/dL (ref 0.0–149.0)
VLDL: 23.2 mg/dL (ref 0.0–40.0)

## 2022-01-01 LAB — TSH: TSH: 2.21 u[IU]/mL (ref 0.35–5.50)

## 2022-01-01 NOTE — Assessment & Plan Note (Signed)
Stable on CPAP treatment. 

## 2022-01-01 NOTE — Assessment & Plan Note (Signed)
Well-controlled hypertension. BP Readings from Last 3 Encounters:  01/01/22 136/82  11/30/20 135/68  11/06/20 (!) 144/73  Continue hydrochlorothiazide 12.5 mg daily and atenolol 50 mg daily. Cardiovascular risk associated with hypertension discussed.

## 2022-01-01 NOTE — Assessment & Plan Note (Signed)
Stable.  Opioid dependent.  Managed by Dr. Marshell Garfinkel. Patient made aware I will not be handling her chronic pain needs.
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
## 2022-01-01 NOTE — Assessment & Plan Note (Signed)
Most likely has paroxysmal atrial fibrillation.  Will need long-term monitoring. Stay on atenolol 50 mg daily and daily baby aspirin in the meantime. Cardiology referral placed today.

## 2022-01-01 NOTE — Patient Instructions (Signed)
Health Maintenance After Age 65 After age 65, you are at a higher risk for certain long-term diseases and infections as well as injuries from falls. Falls are a major cause of broken bones and head injuries in people who are older than age 65. Getting regular preventive care can help to keep you healthy and well. Preventive care includes getting regular testing and making lifestyle changes as recommended by your health care provider. Talk with your health care provider about: Which screenings and tests you should have. A screening is a test that checks for a disease when you have no symptoms. A diet and exercise plan that is right for you. What should I know about screenings and tests to prevent falls? Screening and testing are the best ways to find a health problem early. Early diagnosis and treatment give you the best chance of managing medical conditions that are common after age 65. Certain conditions and lifestyle choices may make you more likely to have a fall. Your health care provider may recommend: Regular vision checks. Poor vision and conditions such as cataracts can make you more likely to have a fall. If you wear glasses, make sure to get your prescription updated if your vision changes. Medicine review. Work with your health care provider to regularly review all of the medicines you are taking, including over-the-counter medicines. Ask your health care provider about any side effects that may make you more likely to have a fall. Tell your health care provider if any medicines that you take make you feel dizzy or sleepy. Strength and balance checks. Your health care provider may recommend certain tests to check your strength and balance while standing, walking, or changing positions. Foot health exam. Foot pain and numbness, as well as not wearing proper footwear, can make you more likely to have a fall. Screenings, including: Osteoporosis screening. Osteoporosis is a condition that causes  the bones to get weaker and break more easily. Blood pressure screening. Blood pressure changes and medicines to control blood pressure can make you feel dizzy. Depression screening. You may be more likely to have a fall if you have a fear of falling, feel depressed, or feel unable to do activities that you used to do. Alcohol use screening. Using too much alcohol can affect your balance and may make you more likely to have a fall. Follow these instructions at home: Lifestyle Do not drink alcohol if: Your health care provider tells you not to drink. If you drink alcohol: Limit how much you have to: 0-1 drink a day for women. 0-2 drinks a day for men. Know how much alcohol is in your drink. In the [place] one drink equals one 12 oz bottle of beer (355 mL), one 5 oz glass of wine (148 mL), or one 1 oz glass of hard liquor (44 mL). Do not use any products that contain nicotine or tobacco. These products include cigarettes, chewing tobacco, and vaping devices, such as e-cigarettes. If you need help quitting, ask your health care provider. Activity  Follow a regular exercise program to stay fit. This will help you maintain your balance. Ask your health care provider what types of exercise are appropriate for you. If you need a cane or walker, use it as recommended by your health care provider. Wear supportive shoes that have nonskid soles. Safety  Remove any tripping hazards, such as rugs, cords, and clutter. Install safety equipment such as grab bars in bathrooms and safety rails on stairs. Keep rooms and walkways   well-lit. General instructions Talk with your health care provider about your risks for falling. Tell your health care provider if: You fall. Be sure to tell your health care provider about all falls, even ones that seem minor. You feel dizzy, tiredness (fatigue), or off-balance. Take over-the-counter and prescription medicines only as told by your health care provider. These include  supplements. Eat a healthy diet and maintain a healthy weight. A healthy diet includes low-fat dairy products, low-fat (lean) meats, and fiber from whole grains, beans, and lots of fruits and vegetables. Stay current with your vaccines. Schedule regular health, dental, and eye exams. Summary Having a healthy lifestyle and getting preventive care can help to protect your health and wellness after age 65. Screening and testing are the best way to find a health problem early and help you avoid having a fall. Early diagnosis and treatment give you the best chance for managing medical conditions that are more common for people who are older than age 65. Falls are a major cause of broken bones and head injuries in people who are older than age 65. Take precautions to prevent a fall at home. Work with your health care provider to learn what changes you can make to improve your health and wellness and to prevent falls. This information is not intended to replace advice given to you by your health care provider. Make sure you discuss any questions you have with your health care provider. Document Revised: 10/31/2020 Document Reviewed: 10/31/2020 Elsevier Patient Education  2023 Elsevier Inc.  

## 2022-01-01 NOTE — Assessment & Plan Note (Signed)
Differential diagnosis discussed and need for work-up. Blood work done today. Diet and nutrition discussed Mental health discussed

## 2022-01-01 NOTE — Assessment & Plan Note (Signed)
Stable.  Diet and nutrition discussed. Continue atorvastatin 40 mg daily. 

## 2022-01-01 NOTE — Progress Notes (Unsigned)
Cardiology Office Note:    Date:  01/04/2022   ID:  Patricia Davidson, DOB February 04, 1956, MRN [id number]  PCP:  Patient, No Pcp Per   [facility] Providers Cardiologist:  Alverda Skeans, MD Referring MD: Georgina Quint, *   Chief Complaint/Reason for Referral: Palpitations  ASSESSMENT:    1. Palpitations   2. Hyperlipidemia, unspecified hyperlipidemia type   3. Hypertension, unspecified type   4. Aortic atherosclerosis (HCC)   5. OSA on CPAP     PLAN:    In order of problems listed above: 1.  Palpitations: We will obtain echocardiogram and monitor to evaluate further.  Follow-up in 9 months or earlier if needed. 2.  Hyperlipidemia: Given the fact the patient has aortic atherosclerosis on CT scan previously her LDL goal is now less than 70.  A lipid panel drawn recently showed an LDL of 87.  We will increase atorvastatin to 80 mg.  We will check lipid panel, LFTs, and LP(a) in 2 months. 3.  Hypertension: Blood pressures well controlled today 4.  Aortic atherosclerosis: Continue aspirin, statin, and strict blood pressure control. 5.  Obstructive sleep apnea on CPAP: Apparently the patient needs this to be managed.  I will refer her to sleep medicine for further recommendations and then.             Dispo:  Return in about 9 months (around 10/06/2022).      Medication Adjustments/Labs and Tests Ordered: Current medicines are reviewed at length with the patient today.  Concerns regarding medicines are outlined above.  The following changes have been made:     Labs/tests ordered: Orders Placed This Encounter  Procedures   Hepatic function panel   Lipoprotein A (LPA)   Lipid panel   Ambulatory referral to Cardiology   LONG TERM MONITOR (3-14 DAYS)   EKG 12-Lead   ECHOCARDIOGRAM COMPLETE    Medication Changes: Meds ordered this encounter  Medications   atorvastatin (LIPITOR) 80 MG tablet    Sig: Take 1 tablet (80 mg total) by mouth daily.    Dispense:  90  tablet    Refill:  3    Dose increase     Current medicines are reviewed at length with the patient today.  The patient does not have concerns regarding medicines.   History of Present Illness:    FOCUSED PROBLEM LIST:   1.  Hyperlipidemia 2.  Hypertension 3.  Prior COVID-19 infection 4.  Aortic atherosclerosis on renal CT 2020 5.  Prior cardiovascular evaluation at [place] 20 years ago for palpitations including reassuring coronary angiography and Holter monitor 6.  BMI 35 7.  OSA on CPAP  The patient is a 66 y.o. female with the indicated medical history here for recommendations regarding palpitations.  Laboratories were drawn which were unremarkable including a normal TSH.  She tells me she has had palpitations at times.  Maybe once every few weeks she will have palpitations that last a few hours.  Occasionally she will have extra beats that are bothersome and not associate with chest pain or shortness of breath.  She has not had any significant presyncope or syncope.  She denies any paroxysmal nocturnal dyspnea, or orthopnea.  She denies any peripheral edema.  She has not required any emergency room visits or hospitalizations.  She did have an extensive work-up about 20 years ago for palpitations at [facility] that included an echocardiogram, Holter monitor and coronary angiography study.  She tells me all of these studies were reassuring.  She is also diagnosed with obstructive sleep apnea prior to the pandemic.  She was prescribed CPAP but then her sleep medicine doctor retired.  She has been using a CPAP machine but is not being actively managed.  Current Medications: Current Meds  Medication Sig   amphetamine-dextroamphetamine (ADDERALL) 15 MG tablet Take 15 mg by mouth daily as needed.   aspirin EC 81 MG tablet Take 81 mg by mouth at bedtime. Swallow whole.   atenolol (TENORMIN) 50 MG tablet Take 50 mg by mouth at bedtime.    atorvastatin (LIPITOR) 80 MG tablet Take 1  tablet (80 mg total) by mouth daily.   DULoxetine (CYMBALTA) 30 MG capsule Take 90 mg by mouth at bedtime.   hydrochlorothiazide (MICROZIDE) 12.5 MG capsule Take 12.5 mg by mouth at bedtime.    HYDROcodone-acetaminophen (NORCO) 10-325 MG tablet Take 1 tablet by mouth every 6 (six) hours as needed (for breakthrough pain). Takes qid   morphine (MS CONTIN) 60 MG 12 hr tablet Take 120 mg by mouth at bedtime.   [DISCONTINUED] atorvastatin (LIPITOR) 40 MG tablet Take 40 mg by mouth at bedtime.     Allergies:    Sulfa antibiotics and Sulfamethoxazole   Social History:   Social History   Tobacco Use   Smoking status: Never   Smokeless tobacco: Never  Vaping Use   Vaping Use: Never used  Substance Use Topics   Alcohol use: Yes    Comment: rare occ wine   Drug use: No     Family Hx: Family History  Problem Relation Age of Onset   Heart disease Mother    Heart disease Father    Stroke Father      Review of Systems:   Please see the history of present illness.    All other systems reviewed and are negative.     EKGs/Labs/Other Test Reviewed:    EKG:  EKG performed June 2022 that I personally reviewed demonstrates sinus rhythm; EKG performed today that I personally reviewed demonstrates sinus rhythm.  Prior CV studies: None available  Other studies Reviewed: Review of the additional studies/records demonstrates: CT abdomen pelvis renal stone study 2022 with aortic atherosclerosis; chest CT 2019 without coronary artery calcification  Recent Labs: 01/01/2022: ALT 18; BUN 16; Creatinine, Ser 0.66; Hemoglobin 11.8; Platelets 301.0; Potassium 3.8; Sodium 140; TSH 2.21   Recent Lipid Panel Lab Results  Component Value Date/Time   CHOL 163 01/01/2022 02:19 PM   TRIG 116.0 01/01/2022 02:19 PM   HDL 52.20 01/01/2022 02:19 PM   LDLCALC 87 01/01/2022 02:19 PM    Risk Assessment/Calculations:           Physical Exam:    VS:  BP 126/70   Pulse 71   Ht 6' (1.829 m)   Wt 264  lb (119.7 kg)   SpO2 97%   BMI 35.80 kg/m    Wt Readings from Last 3 Encounters:  01/04/22 264 lb (119.7 kg)  01/01/22 264 lb 4 oz (119.9 kg)  11/30/20 261 lb 3.2 oz (118.5 kg)    GENERAL:  No apparent distress, AOx3 HEENT:  No carotid bruits, +2 carotid impulses, no scleral icterus CAR: RRR no murmurs, gallops, rubs, or thrills RES:  Clear to auscultation bilaterally ABD:  Soft, nontender, nondistended, positive bowel sounds x 4 VASC:  +2 radial pulses, +2 carotid pulses, palpable pedal pulses NEURO:  CN 2-12 grossly intact; motor and sensory grossly intact PSYCH:  No active depression or anxiety EXT:  No edema, ecchymosis,  or cyanosis  Signed, Orbie Pyo, MD  01/04/2022 9:24 AM    [facility] [address] [place], [place], [place]  [address] Phone: [phone]; Fax: [phone]   Note:  This document was prepared using Dragon voice recognition software and may include unintentional dictation errors.

## 2022-01-04 ENCOUNTER — Encounter: Payer: Self-pay | Admitting: Internal Medicine

## 2022-01-04 ENCOUNTER — Ambulatory Visit (INDEPENDENT_AMBULATORY_CARE_PROVIDER_SITE_OTHER): Payer: Medicare Other | Admitting: Internal Medicine

## 2022-01-04 ENCOUNTER — Ambulatory Visit: Payer: Medicare Other

## 2022-01-04 VITALS — BP 126/70 | HR 71 | Ht 72.0 in | Wt 264.0 lb

## 2022-01-04 DIAGNOSIS — I10 Essential (primary) hypertension: Secondary | ICD-10-CM

## 2022-01-04 DIAGNOSIS — G4733 Obstructive sleep apnea (adult) (pediatric): Secondary | ICD-10-CM

## 2022-01-04 DIAGNOSIS — R002 Palpitations: Secondary | ICD-10-CM | POA: Diagnosis not present

## 2022-01-04 DIAGNOSIS — I700 Atherosclerosis of aorta: Secondary | ICD-10-CM | POA: Diagnosis not present

## 2022-01-04 DIAGNOSIS — E785 Hyperlipidemia, unspecified: Secondary | ICD-10-CM

## 2022-01-04 DIAGNOSIS — Z9989 Dependence on other enabling machines and devices: Secondary | ICD-10-CM

## 2022-01-04 MED ORDER — ATORVASTATIN CALCIUM 80 MG PO TABS: 80.0000 mg | ORAL_TABLET | Freq: Every day | ORAL | 3 refills | Status: DC

## 2022-01-04 NOTE — Patient Instructions (Signed)
Medication Instructions:  Your physician has recommended you make the following change in your medication:  1.) increase atorvastatin to 80 mg - one tablet daily  *If you need a refill on your cardiac medications before your next appointment, please call your pharmacy*   Lab Work: Please return in 2 months for lipids, liver, Lp(a)  If you have labs (blood work) drawn today and your tests are completely normal, you will receive your results only by: MyChart Message (if you have MyChart) OR A paper copy in the mail If you have any lab test that is abnormal or we need to change your treatment, we will call you to review the results.   Testing/Procedures: Your physician has requested that you have an echocardiogram. Echocardiography is a painless test that uses sound waves to create images of your heart. It provides your doctor with information about the size and shape of your heart and how well your heart's chambers and valves are working. This procedure takes approximately one hour. There are no restrictions for this procedure.  14 day Zio Monitor - home enrollment - see instructions below   Follow-Up: At [facility], you and your health needs are our priority.  As part of our continuing mission to provide you with exceptional heart care, we have created designated Provider Care Teams.  These Care Teams include your primary Cardiologist (physician) and Advanced Practice Providers (APPs -  Physician Assistants and Nurse Practitioners) who all work together to provide you with the care you need, when you need it.  Your next appointment:   9 month(s)  The format for your next appointment:   In Person  Provider:   Alverda Skeans, MD   Other Instructions  Christena Deem- Long Term Monitor Instructions  Your physician has requested you wear a ZIO patch monitor for 14 days.  This is a single patch monitor. [employer] supplies one patch monitor per enrollment. Additional stickers are not  available. Please do not apply patch if you will be having a Nuclear Stress Test,  Echocardiogram, Cardiac CT, MRI, or Chest Xray during the period you would be wearing the  monitor. The patch cannot be worn during these tests. You cannot remove and re-apply the  ZIO XT patch monitor.  Your ZIO patch monitor will be mailed 3 day USPS to your address on file. It may take 3-5 days  to receive your monitor after you have been enrolled.  Once you have received your monitor, please review the enclosed instructions. Your monitor  has already been registered assigning a specific monitor serial # to you.  Billing and Patient Assistance Program Information  We have supplied [employer] with any of your insurance information on file for billing purposes. [employer] offers a sliding scale Patient Assistance Program for patients that do not have  insurance, or whose insurance does not completely cover the cost of the ZIO monitor.  You must apply for the Patient Assistance Program to qualify for this discounted rate.  To apply, please call [employer] at [phone], select option 4, select option 2, ask to apply for  Patient Assistance Program. Meredeth Ide will ask your household income, and how many people  are in your household. They will quote your out-of-pocket cost based on that information.  [employer] will also be able to set up a 4-month, interest-free payment plan if needed.  Applying the monitor   Shave hair from upper left chest.  Hold abrader disc by orange tab. Rub abrader in 40 strokes over the  upper left chest as  indicated in your monitor instructions.  Clean area with 4 enclosed alcohol pads. Let dry.  Apply patch as indicated in monitor instructions. Patch will be placed under collarbone on left  side of chest with arrow pointing upward.  Rub patch adhesive wings for 2 minutes. Remove white label marked "1". Remove the white  label marked "2". Rub patch adhesive wings for 2 additional minutes.   While looking in a mirror, press and release button in center of patch. A small green light will  flash 3-4 times. This will be your only indicator that the monitor has been turned on.  Do not shower for the first 24 hours. You may shower after the first 24 hours.  Press the button if you feel a symptom. You will hear a small click. Record Date, Time and  Symptom in the Patient Logbook.  When you are ready to remove the patch, follow instructions on the last 2 pages of Patient  Logbook. Stick patch monitor onto the last page of Patient Logbook.  Place Patient Logbook in the blue and white box. Use locking tab on box and tape box closed  securely. The blue and white box has prepaid postage on it. Please place it in the mailbox as  soon as possible. Your physician should have your test results approximately 7 days after the  monitor has been mailed back to [facility].  Call [facility] Customer Care at [phone] if you have questions regarding  your ZIO XT patch monitor. Call them immediately if you see an orange light blinking on your  monitor.  If your monitor falls off in less than 4 days, contact our Monitor department at [phone].  If your monitor becomes loose or falls off after 4 days call [employer] at [phone] for  suggestions on securing your monitor

## 2022-01-04 NOTE — Progress Notes (Unsigned)
Enrolled patient for a 14 day Zio XT  monitor to be mailed to patients home  °
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
## 2022-01-08 ENCOUNTER — Ambulatory Visit (INDEPENDENT_AMBULATORY_CARE_PROVIDER_SITE_OTHER): Payer: Medicare Other | Admitting: Cardiology

## 2022-01-08 ENCOUNTER — Encounter: Payer: Self-pay | Admitting: Cardiology

## 2022-01-08 VITALS — BP 122/62 | HR 73 | Ht 72.0 in | Wt 266.8 lb

## 2022-01-08 DIAGNOSIS — I10 Essential (primary) hypertension: Secondary | ICD-10-CM | POA: Diagnosis not present

## 2022-01-08 DIAGNOSIS — G4733 Obstructive sleep apnea (adult) (pediatric): Secondary | ICD-10-CM | POA: Diagnosis not present

## 2022-01-08 NOTE — Addendum Note (Signed)
Addended by: Theresia Majors on: 01/08/2022 09:46 AM   Modules accepted: Orders

## 2022-01-08 NOTE — Patient Instructions (Signed)
Medication Instructions:  Your physician recommends that you continue on your current medications as directed. Please refer to the Current Medication list given to you today.  *If you need a refill on your cardiac medications before your next appointment, please call your pharmacy*  Testing/Procedures: Your physician has recommended that you have a sleep study. This test records several body functions during sleep, including: brain activity, eye movement, oxygen and carbon dioxide blood levels, heart rate and rhythm, breathing rate and rhythm, the flow of air through your mouth and nose, snoring, body muscle movements, and chest and belly movement.  Follow-Up: At [facility], you and your health needs are our priority.  As part of our continuing mission to provide you with exceptional heart care, we have created designated Provider Care Teams.  These Care Teams include your primary Cardiologist (physician) and Advanced Practice Providers (APPs -  Physician Assistants and Nurse Practitioners) who all work together to provide you with the care you need, when you need it.  Your next appointment:   1 year(s)  The format for your next appointment:   In Person  Provider:   Armanda Magic, MD  Important Information About Sugar

## 2022-01-24 ENCOUNTER — Ambulatory Visit: Payer: BC Managed Care – PPO

## 2022-01-25 ENCOUNTER — Ambulatory Visit
Admission: RE | Admit: 2022-01-25 | Discharge: 2022-01-25 | Disposition: A | Discharge status: Home / Self Care | Payer: Medicare Other | Source: Ambulatory Visit | Attending: Emergency Medicine | Admitting: Emergency Medicine

## 2022-01-25 DIAGNOSIS — Z1231 Encounter for screening mammogram for malignant neoplasm of breast: Secondary | ICD-10-CM

## 2022-02-16 ENCOUNTER — Other Ambulatory Visit: Payer: Self-pay | Admitting: Emergency Medicine

## 2022-02-16 DIAGNOSIS — R928 Other abnormal and inconclusive findings on diagnostic imaging of breast: Secondary | ICD-10-CM

## 2022-02-17 NOTE — Progress Notes (Signed)
Thank you :)

## 2022-02-23 ENCOUNTER — Ambulatory Visit: Admission: RE | Admit: 2022-02-23 | Payer: BC Managed Care – PPO | Source: Ambulatory Visit

## 2022-02-23 ENCOUNTER — Ambulatory Visit
Admission: RE | Admit: 2022-02-23 | Discharge: 2022-02-23 | Disposition: A | Discharge status: Home / Self Care | Payer: Medicare Other | Source: Ambulatory Visit | Attending: Emergency Medicine | Admitting: Emergency Medicine

## 2022-02-23 DIAGNOSIS — R928 Other abnormal and inconclusive findings on diagnostic imaging of breast: Secondary | ICD-10-CM

## 2022-02-25 ENCOUNTER — Ambulatory Visit (HOSPITAL_BASED_OUTPATIENT_CLINIC_OR_DEPARTMENT_OTHER): Payer: Medicare Other | Attending: Cardiology | Admitting: Cardiology

## 2022-02-25 DIAGNOSIS — G4733 Obstructive sleep apnea (adult) (pediatric): Secondary | ICD-10-CM

## 2022-02-25 DIAGNOSIS — I10 Essential (primary) hypertension: Secondary | ICD-10-CM

## 2022-03-05 NOTE — Procedures (Signed)
    Patient Name: Patricia Davidson Date: 02/25/2022 Gender: Female D.O.B: 1956/06/12 Age (years): 53 Referring Provider: Fransico Him MD, ABSM Height (inches): 72 Interpreting Physician: Fransico Him MD, ABSM Weight (lbs): 260 RPSGT: Gwenyth Allegra BMI: 35 MRN: [id number] Neck Size: 15.00  CLINICAL INFORMATION The patient is referred for a split night study with BPAP. MEDICATIONS Medications self-administered by patient taken the night of the study : N/A  SLEEP STUDY TECHNIQUE As per the AASM Manual for the Scoring of Sleep and Associated Events v2.3 (April 2016) with a hypopnea requiring 4% desaturations.  The channels recorded and monitored were frontal, central and occipital EEG, electrooculogram (EOG), submentalis EMG (chin), nasal and oral airflow, thoracic and abdominal wall motion, anterior tibialis EMG, snore microphone, electrocardiogram, and pulse oximetry. Bi-level positive airway pressure (BiPAP) was initiated when the patient met split night criteria and was titrated according to treat sleep-disordered breathing.  RESPIRATORY PARAMETERS Diagnostic Total AHI (/hr): 30.8  RDI (/hr):46.6  OA Index (/hr): 2.7  CA Index (/hr): 0.5 REM AHI (/hr): N/A  NREM AHI (/hr):30.8  Supine AHI (/hr):35.3  Non-supine AHI (/hr):22.87 Min O2 Sat (%):81.0  Mean O2 (%): 94.5  Time below 88% (min):1.5   Titration Optimal IPAP Pressure (cm): N/A  Optimal EPAP Pressure (cm):N/A  AHI at Optimal Pressure (/hr):N/A  Min O2 at Optimal Pressure (%):N/A Sleep % at Optimal (%):N/A  Supine % at Optimal (%):N/A   SLEEP ARCHITECTURE The study was initiated at 10:25:14 PM and terminated at 5:13:32 AM. The total recorded time was 408.3 minutes. EEG confirmed total sleep time was 252.6 minutes yielding a sleep efficiency of 61.9%. Sleep onset after lights out was 58.2 minutes with a REM latency of N/A minutes. The patient spent 12.9% of the night in stage N1 sleep, 87.1% in stage N2  sleep, 0.0% in stage N3 and 0% in REM. Wake after sleep onset (WASO) was 97.5 minutes. The Arousal Index was 40.9/hour.  LEG MOVEMENT DATA The total Periodic Limb Movements of Sleep (PLMS) were 0. The PLMS index was 0.0 .  CARDIAC DATA The 2 lead EKG demonstrated sinus rhythm. The mean heart rate was 100.0 beats per minute. Other EKG findings include: None.  IMPRESSIONS - Severe obstructive sleep apnea occurred during the diagnostic portion of the study (AHI = 30.8 /hour). An optimal PAP pressure could not be selected due to ongoing respiratory events.  - No significant central sleep apnea occurred during the diagnostic portion of the study (CAI = 0.5/hour). - The patient snored with moderate snoring volume during the diagnostic portion of the study. - No cardiac abnormalities were noted during this study. - Clinically significant periodic limb movements of sleep did not occur during the study.  DIAGNOSIS - Obstructive Sleep Apnea (G47.33) - Nocturnal Hypoxemia  RECOMMENDATIONS - Trial of auto BiPAP therapy with IPAP max 20 cm H2O, EPAP min 5cm H2O and PS 4cm H2O with a Small size Fisher&Paykel Full Face Vitera mask and heated humidification. - Avoid alcohol, sedatives and other CNS depressants that may worsen sleep apnea and disrupt normal sleep architecture. - Sleep hygiene should be reviewed to assess factors that may improve sleep quality. - Weight management and regular exercise should be initiated or continued. - Return to Sleep Center for re-evaluation in 6 weeks.  [Electronically signed] 03/05/2022 10:43 AM  Fransico Him MD, ABSM Diplomate, American Board of Sleep Medicine

## 2022-03-06 ENCOUNTER — Ambulatory Visit (HOSPITAL_COMMUNITY): Payer: Medicare Other | Attending: Internal Medicine

## 2022-03-06 ENCOUNTER — Ambulatory Visit (HOSPITAL_BASED_OUTPATIENT_CLINIC_OR_DEPARTMENT_OTHER): Payer: Medicare Other

## 2022-03-06 DIAGNOSIS — Z9989 Dependence on other enabling machines and devices: Secondary | ICD-10-CM | POA: Diagnosis present

## 2022-03-06 DIAGNOSIS — E785 Hyperlipidemia, unspecified: Secondary | ICD-10-CM

## 2022-03-06 DIAGNOSIS — I10 Essential (primary) hypertension: Secondary | ICD-10-CM

## 2022-03-06 DIAGNOSIS — R002 Palpitations: Secondary | ICD-10-CM

## 2022-03-06 DIAGNOSIS — G4733 Obstructive sleep apnea (adult) (pediatric): Secondary | ICD-10-CM

## 2022-03-06 DIAGNOSIS — I700 Atherosclerosis of aorta: Secondary | ICD-10-CM

## 2022-03-06 LAB — ECHOCARDIOGRAM COMPLETE
Area-P 1/2: 2.83 cm2
S' Lateral: 3.5 cm

## 2022-03-07 ENCOUNTER — Telehealth: Payer: Self-pay

## 2022-03-07 DIAGNOSIS — E785 Hyperlipidemia, unspecified: Secondary | ICD-10-CM

## 2022-03-07 LAB — LIPID PANEL
Chol/HDL Ratio: 3.3 ratio (ref 0.0–4.4)
Cholesterol, Total: 163 mg/dL (ref 100–199)
HDL: 50 mg/dL (ref >39)
LDL Chol Calc (NIH): 86 mg/dL (ref 0–99)
Triglycerides: 155 mg/dL — ABNORMAL HIGH (ref 0–149)
VLDL Cholesterol Cal: 27 mg/dL (ref 5–40)

## 2022-03-07 LAB — LIPOPROTEIN A (LPA): Lipoprotein (a): 118.1 nmol/L — ABNORMAL HIGH (ref <75.0)

## 2022-03-07 LAB — HEPATIC FUNCTION PANEL
ALT: 18 IU/L (ref 0–32)
AST: 13 IU/L (ref 0–40)
Albumin: 4.2 g/dL (ref 3.9–4.9)
Alkaline Phosphatase: 119 IU/L (ref 44–121)
Bilirubin Total: 0.4 mg/dL (ref 0.0–1.2)
Bilirubin, Direct: 0.12 mg/dL (ref 0.00–0.40)
Total Protein: 7 g/dL (ref 6.0–8.5)

## 2022-03-07 MED ORDER — EZETIMIBE 10 MG PO TABS: 10.0000 mg | ORAL_TABLET | Freq: Every day | ORAL | 3 refills | Status: DC

## 2022-03-07 NOTE — Telephone Encounter (Signed)
The patient has been notified of the result and verbalized understanding.  All questions (if any) were answered. Theresia Majors, RN 03/07/2022 9:17 AM  Prescription for zetia 10 mg has been sent in. Referral has been placed. Labs have been scheduled.

## 2022-03-07 NOTE — Telephone Encounter (Signed)
-----   Message from Orbie Pyo, MD sent at 03/07/2022  8:46 AM EDT ----- Please start Zetia 10 mg daily, lets get a lipid panel in 2 months, and refer to Dr. Rennis Golden for elevated LP(a).

## 2022-03-09 ENCOUNTER — Telehealth: Payer: Self-pay | Admitting: *Deleted

## 2022-03-09 NOTE — Telephone Encounter (Signed)
The patient has been notified of the result and verbalized understanding.  All questions (if any) were answered. Latrelle Dodrill, CMA 03/09/2022 11:23 AM    Upon patient request DME selection is [facility] Patient understands he will be contacted by [facility] to set up his cpap. Patient understands to call if [facility] does not contact him with new setup in a timely manner. Patient understands they will be called once confirmation has been received from [facility] that they have received their new machine to schedule 10 week follow up appointment.   [facility] notified of new cpap order  Please add to airview Patient was grateful for the call and thanked me.

## 2022-03-09 NOTE — Telephone Encounter (Signed)
-----   Message from Gaynelle Cage, [place] sent at 03/05/2022  1:14 PM EDT -----  ----- Message ----- From: Quintella Reichert, MD Sent: 03/05/2022  10:46 AM EDT To: [facility] Sleep Studies  Please let patient know that they have sleep apnea with successful PAP titration.  PAP ordered placed in Epic.  Followup in 6 weeks after starting PAP therapy

## 2022-04-03 ENCOUNTER — Encounter: Payer: Self-pay | Admitting: Emergency Medicine

## 2022-04-03 ENCOUNTER — Ambulatory Visit (INDEPENDENT_AMBULATORY_CARE_PROVIDER_SITE_OTHER): Payer: Medicare Other | Admitting: Emergency Medicine

## 2022-04-03 VITALS — BP 132/76 | HR 67 | Temp 98.1°F | Ht 72.0 in | Wt 258.1 lb

## 2022-04-03 DIAGNOSIS — Z23 Encounter for immunization: Secondary | ICD-10-CM | POA: Diagnosis not present

## 2022-04-03 DIAGNOSIS — Z1211 Encounter for screening for malignant neoplasm of colon: Secondary | ICD-10-CM

## 2022-04-03 DIAGNOSIS — G4733 Obstructive sleep apnea (adult) (pediatric): Secondary | ICD-10-CM | POA: Diagnosis not present

## 2022-04-03 DIAGNOSIS — I10 Essential (primary) hypertension: Secondary | ICD-10-CM

## 2022-04-03 DIAGNOSIS — G894 Chronic pain syndrome: Secondary | ICD-10-CM

## 2022-04-03 DIAGNOSIS — I700 Atherosclerosis of aorta: Secondary | ICD-10-CM | POA: Insufficient documentation

## 2022-04-03 DIAGNOSIS — E785 Hyperlipidemia, unspecified: Secondary | ICD-10-CM

## 2022-04-03 NOTE — Assessment & Plan Note (Signed)
Well-controlled hypertension. BP Readings from Last 3 Encounters:  04/03/22 132/76  01/08/22 122/62  01/04/22 126/70  Continue atenolol 50 mg and hydrochlorothiazide 12.5 mg  daily. Cardiovascular risks associated with hypertension discussed. Dietary approaches to stop hypertension discussed.

## 2022-04-03 NOTE — Assessment & Plan Note (Signed)
Stable.  Recently had atorvastatin increased to 80 mg daily and started on Zetia 10 mg daily The 10-year ASCVD risk score (Arnett DK, et al., 2019) is: 7.4%   Values used to calculate the score:     Age: 66 years     Sex: Female     Is Non-Hispanic African American: No     Diabetic: No     Tobacco smoker: No     Systolic Blood Pressure: 403 mmHg     Is BP treated: Yes     HDL Cholesterol: 50 mg/dL     Total Cholesterol: 163 mg/dL

## 2022-04-03 NOTE — Patient Instructions (Signed)
Health Maintenance After Age 65 After age 65, you are at a higher risk for certain long-term diseases and infections as well as injuries from falls. Falls are a major cause of broken bones and head injuries in people who are older than age 65. Getting regular preventive care can help to keep you healthy and well. Preventive care includes getting regular testing and making lifestyle changes as recommended by your health care provider. Talk with your health care provider about: Which screenings and tests you should have. A screening is a test that checks for a disease when you have no symptoms. A diet and exercise plan that is right for you. What should I know about screenings and tests to prevent falls? Screening and testing are the best ways to find a health problem early. Early diagnosis and treatment give you the best chance of managing medical conditions that are common after age 65. Certain conditions and lifestyle choices may make you more likely to have a fall. Your health care provider may recommend: Regular vision checks. Poor vision and conditions such as cataracts can make you more likely to have a fall. If you wear glasses, make sure to get your prescription updated if your vision changes. Medicine review. Work with your health care provider to regularly review all of the medicines you are taking, including over-the-counter medicines. Ask your health care provider about any side effects that may make you more likely to have a fall. Tell your health care provider if any medicines that you take make you feel dizzy or sleepy. Strength and balance checks. Your health care provider may recommend certain tests to check your strength and balance while standing, walking, or changing positions. Foot health exam. Foot pain and numbness, as well as not wearing proper footwear, can make you more likely to have a fall. Screenings, including: Osteoporosis screening. Osteoporosis is a condition that causes  the bones to get weaker and break more easily. Blood pressure screening. Blood pressure changes and medicines to control blood pressure can make you feel dizzy. Depression screening. You may be more likely to have a fall if you have a fear of falling, feel depressed, or feel unable to do activities that you used to do. Alcohol use screening. Using too much alcohol can affect your balance and may make you more likely to have a fall. Follow these instructions at home: Lifestyle Do not drink alcohol if: Your health care provider tells you not to drink. If you drink alcohol: Limit how much you have to: 0-1 drink a day for women. 0-2 drinks a day for men. Know how much alcohol is in your drink. In the [place] one drink equals one 12 oz bottle of beer (355 mL), one 5 oz glass of wine (148 mL), or one 1 oz glass of hard liquor (44 mL). Do not use any products that contain nicotine or tobacco. These products include cigarettes, chewing tobacco, and vaping devices, such as e-cigarettes. If you need help quitting, ask your health care provider. Activity  Follow a regular exercise program to stay fit. This will help you maintain your balance. Ask your health care provider what types of exercise are appropriate for you. If you need a cane or walker, use it as recommended by your health care provider. Wear supportive shoes that have nonskid soles. Safety  Remove any tripping hazards, such as rugs, cords, and clutter. Install safety equipment such as grab bars in bathrooms and safety rails on stairs. Keep rooms and walkways   well-lit. General instructions Talk with your health care provider about your risks for falling. Tell your health care provider if: You fall. Be sure to tell your health care provider about all falls, even ones that seem minor. You feel dizzy, tiredness (fatigue), or off-balance. Take over-the-counter and prescription medicines only as told by your health care provider. These include  supplements. Eat a healthy diet and maintain a healthy weight. A healthy diet includes low-fat dairy products, low-fat (lean) meats, and fiber from whole grains, beans, and lots of fruits and vegetables. Stay current with your vaccines. Schedule regular health, dental, and eye exams. Summary Having a healthy lifestyle and getting preventive care can help to protect your health and wellness after age 65. Screening and testing are the best way to find a health problem early and help you avoid having a fall. Early diagnosis and treatment give you the best chance for managing medical conditions that are more common for people who are older than age 65. Falls are a major cause of broken bones and head injuries in people who are older than age 65. Take precautions to prevent a fall at home. Work with your health care provider to learn what changes you can make to improve your health and wellness and to prevent falls. This information is not intended to replace advice given to you by your health care provider. Make sure you discuss any questions you have with your health care provider. Document Revised: 10/31/2020 Document Reviewed: 10/31/2020 Elsevier Patient Education  2023 Elsevier Inc.  

## 2022-04-03 NOTE — Assessment & Plan Note (Signed)
Stable.  Tolerating CPAP treatment.

## 2022-04-03 NOTE — Assessment & Plan Note (Signed)
Stable and well-controlled.  Managed by pain clinic. Opiate dependent.
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
## 2022-04-24 LAB — COLOGUARD: COLOGUARD: POSITIVE — AB

## 2022-04-25 ENCOUNTER — Other Ambulatory Visit: Payer: Self-pay | Admitting: Emergency Medicine

## 2022-04-25 DIAGNOSIS — R195 Other fecal abnormalities: Secondary | ICD-10-CM

## 2022-05-07 ENCOUNTER — Ambulatory Visit: Payer: Medicare Other | Attending: Internal Medicine

## 2022-05-07 DIAGNOSIS — E785 Hyperlipidemia, unspecified: Secondary | ICD-10-CM

## 2022-05-07 LAB — LIPID PANEL
Chol/HDL Ratio: 2.4 ratio (ref 0.0–4.4)
Cholesterol, Total: 132 mg/dL (ref 100–199)
HDL: 55 mg/dL (ref >39)
LDL Chol Calc (NIH): 58 mg/dL (ref 0–99)
Triglycerides: 104 mg/dL (ref 0–149)
VLDL Cholesterol Cal: 19 mg/dL (ref 5–40)

## 2022-05-08 ENCOUNTER — Telehealth: Payer: Self-pay | Admitting: *Deleted

## 2022-05-08 ENCOUNTER — Encounter: Payer: Self-pay | Admitting: Cardiology

## 2022-05-08 ENCOUNTER — Ambulatory Visit: Payer: Medicare Other | Attending: Cardiology | Admitting: Cardiology

## 2022-05-08 VITALS — BP 128/70 | HR 75 | Ht 72.0 in | Wt 265.8 lb

## 2022-05-08 DIAGNOSIS — G4733 Obstructive sleep apnea (adult) (pediatric): Secondary | ICD-10-CM

## 2022-05-08 DIAGNOSIS — I10 Essential (primary) hypertension: Secondary | ICD-10-CM | POA: Diagnosis present
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
## 2022-05-08 NOTE — Telephone Encounter (Signed)
-----   Message from Eilleen Kempf, RN sent at 05/08/2022 12:28 PM EST ----- Regarding: pap suppllies Hi    Dr. Mayford Knife  wants a DME mask fitting and download in 6 weeks She needs PAP supplies.   Thanks MA

## 2022-05-08 NOTE — Telephone Encounter (Signed)
   Pre-operative Risk Assessment    Patient Name: Patricia Davidson  DOB: [birth year] MRN: [id number]      Request for Surgical Clearance    Procedure:   LEFT SHOULDER REVERSE ARTHROPLASTY  Date of Surgery:  Clearance TBD                                 Surgeon:  DR. Duwayne Heck Surgeon's Group or Practice Name:  [facility] Phone number:  [phone] Fax number:  [phone] ATTN: KERRI MAZE   Type of Clearance Requested:   - Medical ; ASA    Type of Anesthesia:   CHOICE   Additional requests/questions:    Elpidio Anis   05/08/2022, 12:43 PM

## 2022-05-08 NOTE — Patient Instructions (Addendum)
Medication Instructions:  Your physician recommends that you continue on your current medications as directed. Please refer to the Current Medication list given to you today.  *If you need a refill on your cardiac medications before your next appointment, please call your pharmacy*   Follow-Up: At [facility], you and your health needs are our priority.  As part of our continuing mission to provide you with exceptional heart care, we have created designated Provider Care Teams.  These Care Teams include your primary Cardiologist (physician) and Advanced Practice Providers (APPs -  Physician Assistants and Nurse Practitioners) who all work together to provide you with the care you need, when you need it.  We recommend signing up for the patient portal called "MyChart".  Sign up information is provided on this After Visit Summary.  MyChart is used to connect with patients for Virtual Visits (Telemedicine).  Patients are able to view lab/test results, encounter notes, upcoming appointments, etc.  Non-urgent messages can be sent to your provider as well.   To learn more about what you can do with MyChart, go to [email or web address].    Your next appointment:   3 months   The format for your next appointment:   In Person or virtual  Provider:   Dr. Mayford Knife   Important Information About Sugar

## 2022-05-08 NOTE — Telephone Encounter (Signed)
Order placed to [facility] via [employer].

## 2022-05-09 NOTE — Telephone Encounter (Signed)
   Name: Patricia Davidson  DOB: 08-06-1955  MRN: [id number]  Primary Cardiologist: Orbie Pyo, MD  Chart reviewed as part of pre-operative protocol coverage. Because of Patricia Davidson's past medical history and time since last visit, she will require a follow-up in-office visit in order to better assess preoperative cardiovascular risk.  Pre-op covering staff: - Please schedule appointment and call patient to inform them. If patient already had an upcoming appointment within acceptable timeframe, please add "pre-op clearance" to the appointment notes so provider is aware. - Please contact requesting surgeon's office via preferred method (i.e, phone, fax) to inform them of need for appointment prior to surgery.  ASA hold recommendations can be made at the time of her in-person appointment.   Sharlene Dory, PA-C  05/09/2022, 1:41 PM

## 2022-05-09 NOTE — Telephone Encounter (Signed)
S/w the pt and she is agreeable to plan of care for in office appt for pre op. Pt has been scheduled to see Tereso Newcomer, [facility] 05/14/22 @ 8:50. Pt thanked me for the help.

## 2022-05-13 DIAGNOSIS — Z01810 Encounter for preprocedural cardiovascular examination: Secondary | ICD-10-CM | POA: Insufficient documentation
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
## 2022-05-14 ENCOUNTER — Ambulatory Visit: Payer: BC Managed Care – PPO | Admitting: Physician Assistant

## 2022-05-14 DIAGNOSIS — Z01810 Encounter for preprocedural cardiovascular examination: Secondary | ICD-10-CM
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
## 2022-05-15 ENCOUNTER — Encounter: Payer: Self-pay | Admitting: Nurse Practitioner

## 2022-05-15 ENCOUNTER — Ambulatory Visit: Payer: Medicare Other | Attending: Physician Assistant | Admitting: Nurse Practitioner

## 2022-05-15 VITALS — BP 124/70 | HR 74 | Ht 72.0 in | Wt 266.0 lb

## 2022-05-15 DIAGNOSIS — G4733 Obstructive sleep apnea (adult) (pediatric): Secondary | ICD-10-CM | POA: Diagnosis not present

## 2022-05-15 DIAGNOSIS — E785 Hyperlipidemia, unspecified: Secondary | ICD-10-CM | POA: Diagnosis not present

## 2022-05-15 DIAGNOSIS — Z01810 Encounter for preprocedural cardiovascular examination: Secondary | ICD-10-CM | POA: Insufficient documentation

## 2022-05-15 DIAGNOSIS — I700 Atherosclerosis of aorta: Secondary | ICD-10-CM | POA: Insufficient documentation

## 2022-05-15 DIAGNOSIS — R002 Palpitations: Secondary | ICD-10-CM | POA: Diagnosis not present

## 2022-05-15 NOTE — Patient Instructions (Signed)
Medication Instructions:  Your physician recommends that you continue on your current medications as directed. Please refer to the Current Medication list given to you today.  *If you need a refill on your cardiac medications before your next appointment, please call your pharmacy*   Lab Work: None Ordered If you have labs (blood work) drawn today and your tests are completely normal, you will receive your results only by: MyChart Message (if you have MyChart) OR A paper copy in the mail If you have any lab test that is abnormal or we need to change your treatment, we will call you to review the results.   Testing/Procedures: None Ordered   Follow-Up: At [facility], you and your health needs are our priority.  As part of our continuing mission to provide you with exceptional heart care, we have created designated Provider Care Teams.  These Care Teams include your primary Cardiologist (physician) and Advanced Practice Providers (APPs -  Physician Assistants and Nurse Practitioners) who all work together to provide you with the care you need, when you need it.  We recommend signing up for the patient portal called "MyChart".  Sign up information is provided on this After Visit Summary.  MyChart is used to connect with patients for Virtual Visits (Telemedicine).  Patients are able to view lab/test results, encounter notes, upcoming appointments, etc.  Non-urgent messages can be sent to your provider as well.   To learn more about what you can do with MyChart, go to [email or web address].    Your next appointment:   6 month(s)  The format for your next appointment:   In Person  Provider:   Orbie Pyo, MD     Other Instructions Adopting a Healthy Lifestyle.   Weight: Know what a healthy weight is for you (roughly BMI <25) and aim to maintain this. You can calculate your body mass index on your smart phone  Diet: Aim for 7+ servings of fruits and vegetables  daily Limit animal fats in diet for cholesterol and heart health - choose grass fed whenever available Avoid highly processed foods (fast food burgers, tacos, fried chicken, pizza, hot dogs, french fries)  Saturated fat comes in the form of butter, lard, coconut oil, margarine, partially hydrogenated oils, and fat in meat. These increase your risk of cardiovascular disease.  Use healthy plant oils, such as olive, canola, soy, corn, sunflower and peanut.  Whole foods such as fruits, vegetables and whole grains have fiber  Men need > 38 grams of fiber per day Women need > 25 grams of fiber per day  Load up on vegetables and fruits - one-half of your plate: Aim for color and variety, and remember that potatoes dont count. Go for whole grains - one-quarter of your plate: Whole wheat, barley, wheat berries, quinoa, oats, brown rice, and foods made with them. If you want pasta, go with whole wheat pasta. Protein power - one-quarter of your plate: Fish, chicken, beans, and nuts are all healthy, versatile protein sources. Limit red meat. You need carbohydrates for energy! The type of carbohydrate is more important than the amount. Choose carbohydrates such as vegetables, fruits, whole grains, beans, and nuts in the place of white rice, white pasta, potatoes (baked or fried), macaroni and cheese, cakes, cookies, and donuts.  If youre thirsty, drink water. Coffee and tea are good in moderation, but skip sugary drinks and limit milk and dairy products to one or two daily servings. Keep sugar intake at 6 teaspoons or  24 grams or LESS       Exercise: Aim for 150 min of moderate intensity exercise weekly for heart health, and weights twice weekly for bone health Stay active - any steps are better than no steps! Aim for 7-9 hours of sleep daily        Important Information About Sugar

## 2022-06-28 NOTE — Progress Notes (Signed)
Sent message, via epic in basket, requesting orders in epic from surgeon.  

## 2022-06-29 NOTE — Patient Instructions (Signed)
DUE TO COVID-19 ONLY TWO VISITORS  (aged 66 and older)  ARE ALLOWED TO COME WITH YOU AND STAY IN THE WAITING ROOM ONLY DURING PRE OP AND PROCEDURE.   **NO VISITORS ARE ALLOWED IN THE SHORT STAY AREA OR RECOVERY ROOM!!**  IF YOU WILL BE ADMITTED INTO THE HOSPITAL YOU ARE ALLOWED ONLY FOUR SUPPORT PEOPLE DURING VISITATION HOURS ONLY (7 AM -8PM)   The support person(s) must pass our screening, gel in and out, and wear a mask at all times, including in the patient's room. Patients must also wear a mask when staff or their support person are in the room. Visitors GUEST BADGE MUST BE WORN VISIBLY  One adult visitor may remain with you overnight and MUST be in the room by 8 P.M.     Your procedure is scheduled on: 07/13/22   Report to [facility] Main Entrance    Report to admitting at : 9:30 AM   Call this number if you have problems the morning of surgery [phone]   Do not eat food :After Midnight.   After Midnight you may have the following liquids until: 9:00 AM DAY OF SURGERY  Water Black Coffee (sugar ok, NO MILK/CREAM OR CREAMERS)  Tea (sugar ok, NO MILK/CREAM OR CREAMERS) regular and decaf                             Plain Jell-O (NO RED)                                           Fruit ices (not with fruit pulp, NO RED)                                     Popsicles (NO RED)                                                                  Juice: apple, WHITE grape, WHITE cranberry Sports drinks like Gatorade (NO RED)   The day of surgery:  Drink ONE (1) Pre-Surgery Clear Ensure or G2 at : 9:00 AM the morning of surgery. Drink in one sitting. Do not sip.  This drink was given to you during your hospital  pre-op appointment visit. Nothing else to drink after completing the  Pre-Surgery Clear Ensure or G2.          If you have questions, please contact your surgeon's office.  Oral Hygiene is also important to reduce your risk of infection.                                     Remember - BRUSH YOUR TEETH THE MORNING OF SURGERY WITH YOUR REGULAR TOOTHPASTE  DENTURES WILL BE REMOVED PRIOR TO SURGERY PLEASE DO NOT APPLY "Poly grip" OR ADHESIVES!!!   Do NOT smoke after Midnight   Take these medicines the morning of surgery with A SIP OF WATER: atenolol.Hydrocodone as needed.  Bring CPAP mask and  tubing day of surgery.                              You may not have any metal on your body including hair pins, jewelry, and body piercing             Do not wear make-up, lotions, powders, perfumes/cologne, or deodorant  Do not wear nail polish including gel and S&S, artificial/acrylic nails, or any other type of covering on natural nails including finger and toenails. If you have artificial nails, gel coating, etc. that needs to be removed by a nail salon please have this removed prior to surgery or surgery may need to be canceled/ delayed if the surgeon/ anesthesia feels like they are unable to be safely monitored.   Do not shave  48 hours prior to surgery.    Do not bring valuables to the hospital. [place].   Contacts, glasses, or bridgework may not be worn into surgery.   Bring small overnight bag day of surgery.   DO NOT [place]. PHARMACY WILL DISPENSE MEDICATIONS LISTED ON YOUR MEDICATION LIST TO YOU DURING YOUR ADMISSION [place]!    Patients discharged on the day of surgery will not be allowed to drive home.  Someone NEEDS to stay with you for the first 24 hours after anesthesia.   Special Instructions: Bring a copy of your healthcare power of attorney and living will documents         the day of surgery if you haven't scanned them before.              Please read over the following fact sheets you were given: IF YOU HAVE QUESTIONS ABOUT YOUR PRE-OP INSTRUCTIONS PLEASE CALL [phone]     [facility] - Preparing for Surgery Before surgery, you can play an  important role.  Because skin is not sterile, your skin needs to be as free of germs as possible.  You can reduce the number of germs on your skin by washing with CHG (chlorahexidine gluconate) soap before surgery.  CHG is an antiseptic cleaner which kills germs and bonds with the skin to continue killing germs even after washing. Please DO NOT use if you have an allergy to CHG or antibacterial soaps.  If your skin becomes reddened/irritated stop using the CHG and inform your nurse when you arrive at Short Stay. Do not shave (including legs and underarms) for at least 48 hours prior to the first CHG shower.  You may shave your face/neck. Please follow these instructions carefully:  1.  Shower with CHG Soap the night before surgery and the  morning of Surgery.  2.  If you choose to wash your hair, wash your hair first as usual with your  normal  shampoo.  3.  After you shampoo, rinse your hair and body thoroughly to remove the  shampoo.                           4.  Use CHG as you would any other liquid soap.  You can apply chg directly  to the skin and wash                       Gently with  a scrungie or clean washcloth.  5.  Apply the CHG Soap to your body ONLY FROM THE NECK DOWN.   Do not use on face/ open                           Wound or open sores. Avoid contact with eyes, ears mouth and genitals (private parts).                       Wash face,  Genitals (private parts) with your normal soap.             6.  Wash thoroughly, paying special attention to the area where your surgery  will be performed.  7.  Thoroughly rinse your body with warm water from the neck down.  8.  DO NOT shower/wash with your normal soap after using and rinsing off  the CHG Soap.                9.  Pat yourself dry with a clean towel.            10.  Wear clean pajamas.            11.  Place clean sheets on your bed the night of your first shower and do not  sleep with pets. Day of Surgery : Do not apply any  lotions/deodorants the morning of surgery.  Please wear clean clothes to the hospital/surgery center.  FAILURE TO FOLLOW THESE INSTRUCTIONS MAY RESULT IN THE CANCELLATION OF YOUR SURGERY PATIENT SIGNATURE_________________________________  NURSE SIGNATURE__________________________________  ________________________________________________________________________ [facility]- Preparing for Total Shoulder Arthroplasty    Before surgery, you can play an important role. Because skin is not sterile, your skin needs to be as free of germs as possible. You can reduce the number of germs on your skin by using the following products. Benzoyl Peroxide Gel Reduces the number of germs present on the skin Applied twice a day to shoulder area starting two days before surgery    ==================================================================  Please follow these instructions carefully:  BENZOYL PEROXIDE 5% GEL  Please do not use if you have an allergy to benzoyl peroxide.   If your skin becomes reddened/irritated stop using the benzoyl peroxide.  Starting two days before surgery, apply as follows: Apply benzoyl peroxide in the morning and at night. Apply after taking a shower. If you are not taking a shower clean entire shoulder front, back, and side along with the armpit with a clean wet washcloth.  Place a quarter-sized dollop on your shoulder and rub in thoroughly, making sure to cover the front, back, and side of your shoulder, along with the armpit.   2 days before ____ AM   ____ PM              1 day before ____ AM   ____ PM                         Do this twice a day for two days.  (Last application is the night before surgery, AFTER using the CHG soap as described below).  Do NOT apply benzoyl peroxide gel on the day of surgery.  Incentive Spirometer  An incentive spirometer is a tool that can help keep your lungs clear and active. This tool measures how well you are filling your lungs  with each breath. Taking long deep breaths may help reverse or decrease  the chance of developing breathing (pulmonary) problems (especially infection) following: A long period of time when you are unable to move or be active. BEFORE THE PROCEDURE  If the spirometer includes an indicator to show your best effort, your nurse or respiratory therapist will set it to a desired goal. If possible, sit up straight or lean slightly forward. Try not to slouch. Hold the incentive spirometer in an upright position. INSTRUCTIONS FOR USE  Sit on the edge of your bed if possible, or sit up as far as you can in bed or on a chair. Hold the incentive spirometer in an upright position. Breathe out normally. Place the mouthpiece in your mouth and seal your lips tightly around it. Breathe in slowly and as deeply as possible, raising the piston or the ball toward the top of the column. Hold your breath for 3-5 seconds or for as long as possible. Allow the piston or ball to fall to the bottom of the column. Remove the mouthpiece from your mouth and breathe out normally. Rest for a few seconds and repeat Steps 1 through 7 at least 10 times every 1-2 hours when you are awake. Take your time and take a few normal breaths between deep breaths. The spirometer may include an indicator to show your best effort. Use the indicator as a goal to work toward during each repetition. After each set of 10 deep breaths, practice coughing to be sure your lungs are clear. If you have an incision (the cut made at the time of surgery), support your incision when coughing by placing a pillow or rolled up towels firmly against it. Once you are able to get out of bed, walk around indoors and cough well. You may stop using the incentive spirometer when instructed by your caregiver.  RISKS AND COMPLICATIONS Take your time so you do not get dizzy or light-headed. If you are in pain, you may need to take or ask for pain medication before doing  incentive spirometry. It is harder to take a deep breath if you are having pain. AFTER USE Rest and breathe slowly and easily. It can be helpful to keep track of a log of your progress. Your caregiver can provide you with a simple table to help with this. If you are using the spirometer at home, follow these instructions: [place] IF:  You are having difficultly using the spirometer. You have trouble using the spirometer as often as instructed. Your pain medication is not giving enough relief while using the spirometer. You develop fever of 100.5 F (38.1 C) or higher. SEEK IMMEDIATE MEDICAL CARE IF:  You cough up bloody sputum that had not been present before. You develop fever of 102 F (38.9 C) or greater. You develop worsening pain at or near the incision site. MAKE SURE YOU:  Understand these instructions. Will watch your condition. Will get help right away if you are not doing well or get worse. Document Released: 10/22/2006 Document Revised: 09/03/2011 Document Reviewed: 12/23/2006 [facility] Patient Information 2014 [place], [place].   ________________________________________________________________________

## 2022-07-02 ENCOUNTER — Encounter (HOSPITAL_COMMUNITY)
Admission: RE | Admit: 2022-07-02 | Discharge: 2022-07-02 | Disposition: A | Discharge status: Home / Self Care | Payer: Medicare PPO | Source: Ambulatory Visit | Attending: Orthopedic Surgery | Admitting: Orthopedic Surgery

## 2022-07-02 ENCOUNTER — Other Ambulatory Visit: Payer: Self-pay

## 2022-07-02 ENCOUNTER — Encounter (HOSPITAL_COMMUNITY): Payer: Self-pay

## 2022-07-02 VITALS — BP 147/71 | HR 65 | Temp 97.9°F | Ht 72.0 in | Wt 267.0 lb

## 2022-07-02 DIAGNOSIS — Z01812 Encounter for preprocedural laboratory examination: Secondary | ICD-10-CM | POA: Insufficient documentation

## 2022-07-02 DIAGNOSIS — G4730 Sleep apnea, unspecified: Secondary | ICD-10-CM | POA: Insufficient documentation

## 2022-07-02 DIAGNOSIS — Z8616 Personal history of COVID-19: Secondary | ICD-10-CM | POA: Diagnosis not present

## 2022-07-02 DIAGNOSIS — M19012 Primary osteoarthritis, left shoulder: Secondary | ICD-10-CM | POA: Diagnosis not present

## 2022-07-02 DIAGNOSIS — Z01818 Encounter for other preprocedural examination: Secondary | ICD-10-CM

## 2022-07-02 DIAGNOSIS — I10 Essential (primary) hypertension: Secondary | ICD-10-CM | POA: Insufficient documentation

## 2022-07-02 LAB — BASIC METABOLIC PANEL
Anion gap: 8 (ref 5–15)
BUN: 15 mg/dL (ref 8–23)
CO2: 30 mmol/L (ref 22–32)
Calcium: 8.8 mg/dL — ABNORMAL LOW (ref 8.9–10.3)
Chloride: 101 mmol/L (ref 98–111)
Creatinine, Ser: 0.45 mg/dL (ref 0.44–1.00)
GFR, Estimated: >60 mL/min (ref >60)
Glucose, Bld: 99 mg/dL (ref 70–99)
Potassium: 3.8 mmol/L (ref 3.5–5.1)
Sodium: 139 mmol/L (ref 135–145)

## 2022-07-02 LAB — CBC
HCT: 34.5 % — ABNORMAL LOW (ref 36.0–46.0)
Hemoglobin: 10.6 g/dL — ABNORMAL LOW (ref 12.0–15.0)
MCH: 30.4 pg (ref 26.0–34.0)
MCHC: 30.7 g/dL (ref 30.0–36.0)
MCV: 98.9 fL (ref 80.0–100.0)
Platelets: 256 10*3/uL (ref 150–400)
RBC: 3.49 MIL/uL — ABNORMAL LOW (ref 3.87–5.11)
RDW: 13.1 % (ref 11.5–15.5)
WBC: 6.2 10*3/uL (ref 4.0–10.5)
nRBC: 0 % (ref 0.0–0.2)

## 2022-07-02 LAB — SURGICAL PCR SCREEN
MRSA, PCR: NEGATIVE
Staphylococcus aureus: NEGATIVE

## 2022-07-02 NOTE — Progress Notes (Addendum)
For Short Stay: [place] appointment date:  Bowel Prep reminder:   For Anesthesia: PCP - Dr. Agustina Caroli. Cardiologist - Dr. Lenna Sciara Clearance: Christen Bame: 05/15/22 Chest x-ray -  EKG - 05/15/22 Stress Test -  ECHO - 03/06/22 Cardiac Cath -  Pacemaker/ICD device last checked: Pacemaker orders received: Device Rep notified:  Spinal Cord Stimulator:  Sleep Study - Yes CPAP - Yes: BIPAP  Fasting Blood Sugar - N/A Checks Blood Sugar _____ times a day Date and result of last Hgb A1c-  Last dose of GLP1 agonist-  GLP1 instructions:   Last dose of SGLT-2 inhibitors-  SGLT-2 instructions:   Blood Thinner Instructions: Aspirin Instructions: On hold starting: starting: 07/06/22 Last Dose:  Activity level: Can go up a flight of stairs and activities of daily living without stopping and without chest pain and/or shortness of breath   Able to exercise without chest pain and/or shortness of breath    Anesthesia review: Hx: PVC's,HTN,Palpitations,OSA(CPAP)  Patient denies shortness of breath, fever, cough and chest pain at PAT appointment   Patient verbalized understanding of instructions that were given to them at the PAT appointment. Patient was also instructed that they will need to review over the PAT instructions again at home before surgery.
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
## 2022-07-04 ENCOUNTER — Other Ambulatory Visit: Payer: Self-pay | Admitting: Orthopedic Surgery

## 2022-07-04 DIAGNOSIS — M19012 Primary osteoarthritis, left shoulder: Secondary | ICD-10-CM

## 2022-07-13 ENCOUNTER — Ambulatory Visit (HOSPITAL_COMMUNITY)
Admission: RE | Admit: 2022-07-13 | Discharge: 2022-07-13 | Disposition: A | Discharge status: Home / Self Care | Payer: Medicare PPO | Attending: Orthopedic Surgery | Admitting: Orthopedic Surgery

## 2022-07-13 ENCOUNTER — Encounter (HOSPITAL_COMMUNITY): Payer: Self-pay | Admitting: Orthopedic Surgery

## 2022-07-13 ENCOUNTER — Ambulatory Visit (HOSPITAL_BASED_OUTPATIENT_CLINIC_OR_DEPARTMENT_OTHER): Payer: Medicare PPO | Admitting: Certified Registered"

## 2022-07-13 ENCOUNTER — Encounter (HOSPITAL_COMMUNITY)
Admission: RE | Disposition: A | Discharge status: Home / Self Care | Payer: Self-pay | Source: Home / Self Care | Attending: Orthopedic Surgery

## 2022-07-13 ENCOUNTER — Ambulatory Visit (HOSPITAL_COMMUNITY): Payer: Medicare PPO

## 2022-07-13 ENCOUNTER — Other Ambulatory Visit: Payer: Self-pay

## 2022-07-13 ENCOUNTER — Ambulatory Visit (HOSPITAL_COMMUNITY): Payer: Medicare PPO | Admitting: Physician Assistant

## 2022-07-13 DIAGNOSIS — M19012 Primary osteoarthritis, left shoulder: Secondary | ICD-10-CM | POA: Diagnosis present

## 2022-07-13 DIAGNOSIS — I10 Essential (primary) hypertension: Secondary | ICD-10-CM | POA: Insufficient documentation

## 2022-07-13 DIAGNOSIS — Z8249 Family history of ischemic heart disease and other diseases of the circulatory system: Secondary | ICD-10-CM | POA: Insufficient documentation

## 2022-07-13 DIAGNOSIS — M25712 Osteophyte, left shoulder: Secondary | ICD-10-CM | POA: Insufficient documentation

## 2022-07-13 DIAGNOSIS — F32A Depression, unspecified: Secondary | ICD-10-CM | POA: Diagnosis not present

## 2022-07-13 DIAGNOSIS — Z79899 Other long term (current) drug therapy: Secondary | ICD-10-CM | POA: Diagnosis not present

## 2022-07-13 DIAGNOSIS — G4730 Sleep apnea, unspecified: Secondary | ICD-10-CM | POA: Insufficient documentation

## 2022-07-13 HISTORY — PX: REVERSE SHOULDER ARTHROPLASTY: SHX5054

## 2022-07-13 SURGERY — ARTHROPLASTY, SHOULDER, TOTAL, REVERSE
Anesthesia: General | Site: Shoulder | Laterality: Left

## 2022-07-13 MED ORDER — ACETAMINOPHEN 500 MG PO TABS
ORAL_TABLET | ORAL | Status: AC
  Filled 2022-07-13: qty 2

## 2022-07-13 MED ORDER — ROCURONIUM BROMIDE 10 MG/ML (PF) SYRINGE
PREFILLED_SYRINGE | INTRAVENOUS | Status: DC | PRN
  Administered 2022-07-13: 80 mg via INTRAVENOUS

## 2022-07-13 MED ORDER — MIDAZOLAM HCL 2 MG/2ML IJ SOLN
1.0000 mg | INTRAMUSCULAR | Status: DC
  Administered 2022-07-13: 1 mg via INTRAVENOUS
  Filled 2022-07-13: qty 2

## 2022-07-13 MED ORDER — FENTANYL CITRATE PF 50 MCG/ML IJ SOSY
50.0000 ug | PREFILLED_SYRINGE | INTRAMUSCULAR | Status: DC
  Administered 2022-07-13: 50 ug via INTRAVENOUS
  Filled 2022-07-13: qty 2

## 2022-07-13 MED ORDER — LIDOCAINE HCL (PF) 2 % IJ SOLN
INTRAMUSCULAR | Status: AC
  Filled 2022-07-13: qty 5

## 2022-07-13 MED ORDER — PROPOFOL 10 MG/ML IV BOLUS
INTRAVENOUS | Status: DC | PRN
  Administered 2022-07-13: 180 mg via INTRAVENOUS

## 2022-07-13 MED ORDER — DEXAMETHASONE SODIUM PHOSPHATE 10 MG/ML IJ SOLN
INTRAMUSCULAR | Status: AC
  Filled 2022-07-13: qty 1

## 2022-07-13 MED ORDER — FENTANYL CITRATE (PF) 100 MCG/2ML IJ SOLN
INTRAMUSCULAR | Status: AC
  Filled 2022-07-13: qty 2

## 2022-07-13 MED ORDER — ORAL CARE MOUTH RINSE: 15.0000 mL | Freq: Once | OROMUCOSAL | Status: AC

## 2022-07-13 MED ORDER — ONDANSETRON HCL 4 MG PO TABS: 4.0000 mg | ORAL_TABLET | Freq: Three times a day (TID) | ORAL | 0 refills | Status: DC | PRN

## 2022-07-13 MED ORDER — ROCURONIUM BROMIDE 10 MG/ML (PF) SYRINGE
PREFILLED_SYRINGE | INTRAVENOUS | Status: AC
  Filled 2022-07-13: qty 10

## 2022-07-13 MED ORDER — PROMETHAZINE HCL 25 MG/ML IJ SOLN
INTRAMUSCULAR | Status: AC
  Filled 2022-07-13: qty 1

## 2022-07-13 MED ORDER — CEFAZOLIN SODIUM-DEXTROSE 2-4 GM/100ML-% IV SOLN
2.0000 g | INTRAVENOUS | Status: AC
  Administered 2022-07-13: 2 g via INTRAVENOUS
  Filled 2022-07-13: qty 100

## 2022-07-13 MED ORDER — 0.9 % SODIUM CHLORIDE (POUR BTL) OPTIME
TOPICAL | Status: DC | PRN
  Administered 2022-07-13: 2000 mL

## 2022-07-13 MED ORDER — LACTATED RINGERS IV SOLN: INTRAVENOUS | Status: DC

## 2022-07-13 MED ORDER — PROPOFOL 10 MG/ML IV BOLUS
INTRAVENOUS | Status: AC
  Filled 2022-07-13: qty 20

## 2022-07-13 MED ORDER — HYDROMORPHONE HCL 1 MG/ML IJ SOLN: 0.2500 mg | INTRAMUSCULAR | Status: DC | PRN

## 2022-07-13 MED ORDER — HYDROCODONE-ACETAMINOPHEN 10-325 MG PO TABS: 1.0000 | ORAL_TABLET | ORAL | 0 refills | Status: AC | PRN

## 2022-07-13 MED ORDER — PROMETHAZINE HCL 25 MG/ML IJ SOLN
6.2500 mg | INTRAMUSCULAR | Status: DC | PRN
  Administered 2022-07-13: 6.25 mg via INTRAVENOUS

## 2022-07-13 MED ORDER — BUPIVACAINE HCL (PF) 0.5 % IJ SOLN
INTRAMUSCULAR | Status: DC | PRN
  Administered 2022-07-13: 10 mL via PERINEURAL

## 2022-07-13 MED ORDER — ONDANSETRON HCL 4 MG/2ML IJ SOLN
INTRAMUSCULAR | Status: AC
  Filled 2022-07-13: qty 2

## 2022-07-13 MED ORDER — BUPIVACAINE LIPOSOME 1.3 % IJ SUSP
INTRAMUSCULAR | Status: DC | PRN
  Administered 2022-07-13: 10 mL via PERINEURAL

## 2022-07-13 MED ORDER — TRANEXAMIC ACID-NACL 1000-0.7 MG/100ML-% IV SOLN
1000.0000 mg | INTRAVENOUS | Status: DC
  Filled 2022-07-13: qty 100

## 2022-07-13 MED ORDER — DEXAMETHASONE SODIUM PHOSPHATE 10 MG/ML IJ SOLN
INTRAMUSCULAR | Status: DC | PRN
  Administered 2022-07-13: 10 mg via INTRAVENOUS

## 2022-07-13 MED ORDER — VANCOMYCIN HCL 1000 MG IV SOLR
INTRAVENOUS | Status: DC | PRN
  Administered 2022-07-13: 1000 mg

## 2022-07-13 MED ORDER — MEPERIDINE HCL 50 MG/ML IJ SOLN: 6.2500 mg | INTRAMUSCULAR | Status: DC | PRN

## 2022-07-13 MED ORDER — VANCOMYCIN HCL 1000 MG IV SOLR
INTRAVENOUS | Status: AC
  Filled 2022-07-13: qty 20

## 2022-07-13 MED ORDER — CHLORHEXIDINE GLUCONATE 0.12 % MT SOLN
15.0000 mL | Freq: Once | OROMUCOSAL | Status: AC
  Administered 2022-07-13: 15 mL via OROMUCOSAL

## 2022-07-13 MED ORDER — ONDANSETRON HCL 4 MG/2ML IJ SOLN
INTRAMUSCULAR | Status: DC | PRN
  Administered 2022-07-13: 4 mg via INTRAVENOUS

## 2022-07-13 MED ORDER — PHENYLEPHRINE HCL-NACL 20-0.9 MG/250ML-% IV SOLN
INTRAVENOUS | Status: DC | PRN
  Administered 2022-07-13: 25 ug/min via INTRAVENOUS

## 2022-07-13 MED ORDER — LIDOCAINE 2% (20 MG/ML) 5 ML SYRINGE
INTRAMUSCULAR | Status: DC | PRN
  Administered 2022-07-13: 80 mg via INTRAVENOUS

## 2022-07-13 MED ORDER — SUGAMMADEX SODIUM 500 MG/5ML IV SOLN
INTRAVENOUS | Status: AC
  Filled 2022-07-13: qty 5

## 2022-07-13 MED ORDER — SUGAMMADEX SODIUM 500 MG/5ML IV SOLN
INTRAVENOUS | Status: DC | PRN
  Administered 2022-07-13: 225 mg via INTRAVENOUS

## 2022-07-13 MED ORDER — STERILE WATER FOR IRRIGATION IR SOLN
Status: DC | PRN
  Administered 2022-07-13: 2000 mL

## 2022-07-13 MED ORDER — FENTANYL CITRATE (PF) 100 MCG/2ML IJ SOLN
INTRAMUSCULAR | Status: DC | PRN
  Administered 2022-07-13: 50 ug via INTRAVENOUS

## 2022-07-13 MED ORDER — AMISULPRIDE (ANTIEMETIC) 5 MG/2ML IV SOLN: 10.0000 mg | Freq: Once | INTRAVENOUS | Status: DC | PRN

## 2022-07-13 MED ORDER — PHENYLEPHRINE HCL-NACL 20-0.9 MG/250ML-% IV SOLN
INTRAVENOUS | Status: AC
  Filled 2022-07-13: qty 250

## 2022-07-13 SURGICAL SUPPLY — 62 items
BAG COUNTER SPONGE SURGICOUNT (BAG) IMPLANT
BAG ZIPLOCK 12X15 (MISCELLANEOUS) ×1 IMPLANT
BIT DRILL FLUTED 3.0 STRL (BIT) IMPLANT
BLADE SAG 18X100X1.27 (BLADE) ×1 IMPLANT
COVER BACK TABLE 60X90IN (DRAPES) ×1 IMPLANT
COVER SURGICAL LIGHT HANDLE (MISCELLANEOUS) ×1 IMPLANT
CUP SUT UNIV REVERS 36+2 LEFT (Cup) IMPLANT
DRAPE ORTHO SPLIT 77X108 STRL (DRAPES) ×2
DRAPE SHEET LG 3/4 BI-LAMINATE (DRAPES) ×1 IMPLANT
DRAPE SURG 17X11 SM STRL (DRAPES) ×1 IMPLANT
DRAPE SURG ORHT 6 SPLT 77X108 (DRAPES) ×2 IMPLANT
DRAPE TOP 10253 STERILE (DRAPES) ×1 IMPLANT
DRAPE U-SHAPE 47X51 STRL (DRAPES) ×1 IMPLANT
DRILL SURG AR 10 (DRILL) IMPLANT
DRSG AQUACEL AG ADV 3.5X 6 (GAUZE/BANDAGES/DRESSINGS) IMPLANT
DRSG AQUACEL AG ADV 3.5X10 (GAUZE/BANDAGES/DRESSINGS) ×1 IMPLANT
DURAPREP 26ML APPLICATOR (WOUND CARE) IMPLANT
ELECT BLADE TIP CTD 4 INCH (ELECTRODE) IMPLANT
ELECT REM PT RETURN 15FT ADLT (MISCELLANEOUS) ×1 IMPLANT
FACESHIELD WRAPAROUND (MASK) ×1 IMPLANT
FACESHIELD WRAPAROUND OR TEAM (MASK) ×1 IMPLANT
GLENOID UNI REV MOD 24 +2 LAT (Joint) IMPLANT
GLENOSPHERE 36 +4 LAT/24 (Joint) IMPLANT
GLOVE BIO SURGEON STRL SZ7.5 (GLOVE) ×4 IMPLANT
GLOVE BIOGEL PI IND STRL 8 (GLOVE) ×2 IMPLANT
GOWN STRL REUS W/ TWL XL LVL3 (GOWN DISPOSABLE) ×2 IMPLANT
GOWN STRL REUS W/TWL XL LVL3 (GOWN DISPOSABLE) ×2
INSERT HUMERAL 36 +6 (Shoulder) IMPLANT
KIT BASIN OR (CUSTOM PROCEDURE TRAY) ×1 IMPLANT
KIT TURNOVER KIT A (KITS) IMPLANT
MANIFOLD NEPTUNE II (INSTRUMENTS) ×1 IMPLANT
NDL TAPERED W/ NITINOL LOOP (MISCELLANEOUS) IMPLANT
NEEDLE TAPERED W/ NITINOL LOOP (MISCELLANEOUS) IMPLANT
NS IRRIG 1000ML POUR BTL (IV SOLUTION) ×1 IMPLANT
PACK SHOULDER (CUSTOM PROCEDURE TRAY) ×1 IMPLANT
PAD CAST 4YDX4 CTTN HI CHSV (CAST SUPPLIES) ×1 IMPLANT
PADDING CAST COTTON 4X4 STRL (CAST SUPPLIES) ×1
PIN SET MODULAR GLENOID SYSTEM (PIN) IMPLANT
RESTRAINT HEAD UNIVERSAL NS (MISCELLANEOUS) IMPLANT
SCREW CENTRAL MOD 30MM (Screw) IMPLANT
SCREW PERI LOCK 5.5X16 (Screw) IMPLANT
SCREW PERI LOCK 5.5X36 (Screw) IMPLANT
SLING ARM FOAM STRAP MED (SOFTGOODS) IMPLANT
SLING ARM IMMOBILIZER MED (SOFTGOODS) IMPLANT
SMARTMIX MINI TOWER (MISCELLANEOUS)
SPONGE T-LAP 4X18 ~~LOC~~+RFID (SPONGE) IMPLANT
STEM HUMERAL UNIVERS SZ8 (Stem) IMPLANT
STRIP CLOSURE SKIN 1/2X4 (GAUZE/BANDAGES/DRESSINGS) ×1 IMPLANT
SUCTION FRAZIER HANDLE 10FR (MISCELLANEOUS) ×1
SUCTION TUBE FRAZIER 10FR DISP (MISCELLANEOUS) ×1 IMPLANT
SUT FIBERWIRE #2 38 T-5 BLUE (SUTURE)
SUT MNCRL AB 3-0 PS2 18 (SUTURE) ×1 IMPLANT
SUT VIC AB 0 CT1 36 (SUTURE) ×1 IMPLANT
SUT VIC AB 1 CT1 36 (SUTURE) ×1 IMPLANT
SUT VIC AB 2-0 CT1 27 (SUTURE) ×1
SUT VIC AB 2-0 CT1 TAPERPNT 27 (SUTURE) ×1 IMPLANT
SUTURE FIBERWR #2 38 T-5 BLUE (SUTURE) IMPLANT
SUTURE TAPE 1.3 40 TPR END (SUTURE) ×2 IMPLANT
SUTURETAPE 1.3 40 TPR END (SUTURE) ×2
TOWEL OR 17X26 10 PK STRL BLUE (TOWEL DISPOSABLE) ×1 IMPLANT
TOWER SMARTMIX MINI (MISCELLANEOUS) IMPLANT
TUBE SUCTION HIGH CAP CLEAR NV (SUCTIONS) ×1 IMPLANT

## 2022-07-13 NOTE — Anesthesia Procedure Notes (Signed)
Anesthesia Regional Block: Interscalene brachial plexus block   Pre-Anesthetic Checklist: , timeout performed,  Correct Patient, Correct Site, Correct Laterality,  Correct Procedure, Correct Position, site marked,  Risks and benefits discussed,  Surgical consent,  Pre-op evaluation,  At surgeon's request and post-op pain management  Laterality: Upper and Left  Prep: chloraprep       Needles:  Injection technique: Single-shot  Needle Type: Stimulator Needle - 40     Needle Length: 4cm  Needle Gauge: 22     Additional Needles:   Procedures:,,,, ultrasound used (permanent image in chart),,    Narrative:  Start time: 07/13/2022 11:19 AM End time: 07/13/2022 11:39 AM Injection made incrementally with aspirations every 5 mL.  Performed by: Personally  Anesthesiologist: Nolon Nations, MD  Additional Notes: BP cuff, SpO2 and EKG monitors applied. Sedation begun. Nerve location verified with ultrasound. Anesthetic injected incrementally, slowly, and after neg aspirations under direct u/s guidance. Good perineural spread. Tolerated well.
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
## 2022-07-13 NOTE — Brief Op Note (Signed)
07/13/2022  1:59 PM  PATIENT:  Patricia Davidson  67 y.o. female  PRE-OPERATIVE DIAGNOSIS:  Left shoulder osteoarthritis  POST-OPERATIVE DIAGNOSIS:  Left shoulder osteoarthritis  PROCEDURE:  Procedure(s) with comments: REVERSE SHOULDER ARTHROPLASTY (Left) - 150  SURGEON:  Surgeon(s) and Role:    * Stann Mainland, Elly Modena, MD - Primary  PHYSICIAN ASSISTANT: Jonelle Sidle, PA-C  ANESTHESIA:   regional and general  EBL:  150 mL   BLOOD ADMINISTERED:none  DRAINS: none   LOCAL MEDICATIONS USED:  NONE  SPECIMEN:  No Specimen  DISPOSITION OF SPECIMEN:  N/A  COUNTS:  YES  TOURNIQUET:  * No tourniquets in log *  DICTATION: .Note written in EPIC  PLAN OF CARE: Discharge to home after PACU  PATIENT DISPOSITION:  PACU - hemodynamically stable.   Delay start of Pharmacological VTE agent (>24hrs) due to surgical blood loss or risk of bleeding: not applicable

## 2022-07-13 NOTE — Discharge Instructions (Addendum)
Orthopedic surgery discharge instructions:  -Maintain postoperative bandage until follow-up appointment.  This is waterproof, and you may begin showering on postoperative day #3.  Do not submerge underwater.  Maintain that bandage until your follow-up appointment in 2 weeks.  -No lifting over 2 pounds with operateive arm.  You may use the arm immediately for activities of daily living such as bathing, washing your face and brushing your teeth, eating, and getting dressed.  Otherwise maintain your sling when you are out of the house and sleeping.  -Apply ice liberally to the shoulder throughout the day.  For mild to moderate pain use Tylenol and Advil as needed around-the-clock.  For breakthrough pain use oxycodone as necessary.  -You will return to see Dr. Rogers in the office in 2 weeks for routine postoperative check with x-rays.  

## 2022-07-13 NOTE — Op Note (Signed)
07/13/2022  2:04 PM  PATIENT:  Patricia Davidson    PRE-OPERATIVE DIAGNOSIS:  Left shoulder osteoarthritis  POST-OPERATIVE DIAGNOSIS:    PROCEDURE:  REVERSE SHOULDER ARTHROPLASTY  SURGEON:  Nicholes Stairs, MD  ASSISTANT: Jonelle Sidle, PA-C  Assistant Attestation:  PA Mcclung was present for the entire procedure.  ANESTHESIA:   General and regional  ESTIMATED BLOOD LOSS: 150 cc  PREOPERATIVE INDICATIONS:  Patricia Davidson is a  67 y.o. female with a diagnosis of Left shoulder osteoarthritis who failed conservative measures and elected for surgical management.    The risks benefits and alternatives were discussed with the patient preoperatively including but not limited to the risks of infection, bleeding, nerve injury, cardiopulmonary complications, the need for revision surgery, dislocation, brachial plexus palsy, incomplete relief of pain, among others, and the patient was willing to proceed.  OPERATIVE IMPLANTS:  Arthrex universe reverse system with a size 8 stem and a standard humeral tray with a +6 polyethylene liner  Standard 25 mm +2 mm baseplate with a 30 mm central screw and a 36+4 mm glenosphere   OPERATIVE FINDINGS: Significant osteoarthritis of glenohumeral joint with large inferior osteophyte and full-thickness cartilage loss on the glenoid.  Long head of biceps tendon was intact.  Rotator cuff muscles intact x 4.  OPERATIVE PROCEDURE: The patient was brought to the operating room and placed in the supine position. General anesthesia was administered. IV antibiotics were given. A Foley was not placed. Time out was performed. The upper extremity was prepped and draped in usual sterile fashion. The patient was in a beachchair position. Deltopectoral approach was carried out. The biceps was released. The subscapularis was released off of the bone.   I then performed circumferential releases of the humerus, and then dislocated the head, and then reamed with the  reamer to the above named size.  I then applied the jig, and cut the humeral head in 30 of retroversion, and then turned my attention to the glenoid.  Deep retractors were placed, and I resected the labrum, and then placed a guidepin into the center position on the glenoid, with slight inferior inclination. I then reamed over the guidepin, and this created a small metaphyseal cancellus blush inferiorly, removing just the cartilage to the subchondral bone superiorly. The base plate was selected and impacted place, and then I secured it centrally with a nonlocking screw, and I had excellent purchase both inferiorly and superiorly. I placed a short locking screws on anterior and posterior aspects.  I then turned my attention to the glenosphere, and then impacted into place and central setscrew was placed for security.  I sequentially broached, and then trialed, and was found to restore soft tissue tension, and it had 2 finger tightness. Therefore the above named components were selected. The shoulder felt stable throughout functional motion.  Before I placed the real prosthesis I had also placed suture tape into the humeral stem to be used for subscapularis repair.  I then impacted the real prosthesis into place, as well as the real humeral tray, and reduced the shoulder. The shoulder had excellent motion, and was stable, and I irrigated the wounds copiously.    I then used these to repair the subscapularis. This came down to bone.  I then irrigated the shoulder copiously once more, applied 1 g of vancomycin powder into the deltopectoral interval, repaired the deltopectoral interval with #2 FiberWire followed by subcutaneous Vicryl, then monocryl for the skin,  with Steri-Strips and sterile gauze for the skin.  The patient was awakened and returned back in stable and satisfactory condition. There no complications and they tolerated the procedure well.  All counts were correct x2.     Disposition:  Patricia Davidson will be in her sling for the first 2 weeks.  She may use the arm immediately for activities of daily living.  Otherwise no lifting over 2 pounds.  She will be discharged home today from PACU I will see her back in the office in 2 weeks with routine postoperative care including 2 x-ray views of the left shoulder.

## 2022-07-13 NOTE — Transfer of Care (Signed)
Immediate Anesthesia Transfer of Care Note  Patient: Patricia Davidson  Procedure(s) Performed: REVERSE SHOULDER ARTHROPLASTY (Left: Shoulder)  Patient Location: PACU  Anesthesia Type:General and Regional  Level of Consciousness: awake, alert , and oriented  Airway & Oxygen Therapy: Patient Spontanous Breathing and Patient connected to face mask oxygen  Post-op Assessment: Report given to RN and Post -op Vital signs reviewed and stable  Post vital signs: Reviewed and stable  Last Vitals:  Vitals Value Taken Time  BP    Temp    Pulse 78 07/13/22 1358  Resp 17 07/13/22 1358  SpO2 100 % 07/13/22 1358  Vitals shown include unvalidated device data.  Last Pain:  Vitals:   07/13/22 1144  TempSrc:   PainSc: 0-No pain      Patients Stated Pain Goal: 4 (38/18/40 3754)  Complications: No notable events documented.

## 2022-07-13 NOTE — Anesthesia Postprocedure Evaluation (Signed)
Anesthesia Post Note  Patient: Patricia Davidson  Procedure(s) Performed: REVERSE SHOULDER ARTHROPLASTY (Left: Shoulder)     Patient location during evaluation: PACU Anesthesia Type: General Level of consciousness: sedated and patient cooperative Pain management: pain level controlled Vital Signs Assessment: post-procedure vital signs reviewed and stable Respiratory status: spontaneous breathing Cardiovascular status: stable Anesthetic complications: no   No notable events documented.  Last Vitals:  Vitals:   07/13/22 1530 07/13/22 1545  BP: (!) 121/55 118/60  Pulse: 86 88  Resp: 15 16  Temp:  36.7 C  SpO2: 93% 95%    Last Pain:  Vitals:   07/13/22 1545  TempSrc:   PainSc: 0-No pain                 Nolon Nations

## 2022-07-13 NOTE — Anesthesia Procedure Notes (Signed)
Procedure Name: Intubation Date/Time: 07/13/2022 12:12 PM  Performed by: Rita Vialpando D, CRNAPre-anesthesia Checklist: Patient identified, Emergency Drugs available, Suction available and Patient being monitored Patient Re-evaluated:Patient Re-evaluated prior to induction Oxygen Delivery Method: Circle system utilized Preoxygenation: Pre-oxygenation with 100% oxygen Induction Type: IV induction Ventilation: Mask ventilation without difficulty Laryngoscope Size: Mac and 4 Grade View: Grade II Tube type: Oral Tube size: 7.0 mm Number of attempts: 1 Airway Equipment and Method: Stylet and Oral airway Placement Confirmation: ETT inserted through vocal cords under direct vision, positive ETCO2 and breath sounds checked- equal and bilateral Secured at: 22 cm Tube secured with: Tape Dental Injury: Teeth and Oropharynx as per pre-operative assessment

## 2022-07-13 NOTE — Anesthesia Preprocedure Evaluation (Addendum)
Anesthesia Evaluation  Patient identified by MRN, date of birth, ID band Patient awake    Reviewed: Allergy & Precautions, NPO status , Patient's Chart, lab work & pertinent test results  Airway Mallampati: II  TM Distance: >3 FB Neck ROM: Full    Dental no notable dental hx. (+) Dental Advisory Given   Pulmonary sleep apnea    Pulmonary exam normal breath sounds clear to auscultation       Cardiovascular hypertension, Pt. on home beta blockers and Pt. on medications Normal cardiovascular exam+ dysrhythmias + Valvular Problems/Murmurs MR  Rhythm:Regular Rate:Normal  Echo 02/2022  1. Left ventricular ejection fraction, by estimation, is 55 to 60%. Left ventricular ejection fraction by 3D volume is 57 %. The left ventricle has normal function. The left ventricle has no regional wall motion abnormalities. Left ventricular diastolic parameters are consistent with Grade I diastolic dysfunction (impaired relaxation).   2. Right ventricular systolic function is normal. The right ventricular size is normal. There is normal pulmonary artery systolic pressure.   3. The mitral valve is normal in structure. Mild mitral valve regurgitation. No evidence of mitral stenosis.   4. The aortic valve is normal in structure. Aortic valve regurgitation is not visualized. No aortic stenosis is present.   5. The inferior vena cava is normal in size with greater than 50% respiratory variability, suggesting right atrial pressure of 3 mmHg.      Neuro/Psych  PSYCHIATRIC DISORDERS  Depression    negative neurological ROS     GI/Hepatic negative GI ROS, Neg liver ROS,,,  Endo/Other  negative endocrine ROS    Renal/GU negative Renal ROS     Musculoskeletal  (+) Arthritis ,    Abdominal  (+) + obese  Peds  Hematology  (+) Blood dyscrasia, anemia   Anesthesia Other Findings   Reproductive/Obstetrics                              Anesthesia Physical Anesthesia Plan  ASA: 2  Anesthesia Plan: General   Post-op Pain Management: Regional block* and Tylenol PO (pre-op)*   Induction: Intravenous  PONV Risk Score and Plan: 3 and Ondansetron, Dexamethasone, Treatment may vary due to age or medical condition and Midazolam  Airway Management Planned: Oral ETT  Additional Equipment:   Intra-op Plan:   Post-operative Plan: Extubation in OR  Informed Consent: I have reviewed the patients History and Physical, chart, labs and discussed the procedure including the risks, benefits and alternatives for the proposed anesthesia with the patient or authorized representative who has indicated his/her understanding and acceptance.     Dental advisory given  Plan Discussed with: CRNA  Anesthesia Plan Comments:         Anesthesia Quick Evaluation

## 2022-07-16 ENCOUNTER — Encounter (HOSPITAL_COMMUNITY): Payer: Self-pay | Admitting: Orthopedic Surgery

## 2022-07-17 ENCOUNTER — Encounter: Payer: Self-pay | Admitting: Internal Medicine

## 2022-07-17 ENCOUNTER — Ambulatory Visit: Payer: Medicare PPO | Attending: Cardiology | Admitting: Internal Medicine

## 2022-07-17 DIAGNOSIS — E782 Mixed hyperlipidemia: Secondary | ICD-10-CM

## 2022-07-17 DIAGNOSIS — I700 Atherosclerosis of aorta: Secondary | ICD-10-CM | POA: Diagnosis not present

## 2022-07-17 DIAGNOSIS — E7841 Elevated Lipoprotein(a): Secondary | ICD-10-CM | POA: Diagnosis not present

## 2022-07-17 DIAGNOSIS — E785 Hyperlipidemia, unspecified: Secondary | ICD-10-CM
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
## 2022-07-17 NOTE — Patient Instructions (Signed)
Medication Instructions:  NO CHANGES  *If you need a refill on your cardiac medications before your next appointment, please call your pharmacy*    Follow-Up: At [facility], you and your health needs are our priority.  As part of our continuing mission to provide you with exceptional heart care, we have created designated Provider Care Teams.  These Care Teams include your primary Cardiologist (physician) and Advanced Practice Providers (APPs -  Physician Assistants and Nurse Practitioners) who all work together to provide you with the care you need, when you need it.  We recommend signing up for the patient portal called "MyChart".  Sign up information is provided on this After Visit Summary.  MyChart is used to connect with patients for Virtual Visits (Telemedicine).  Patients are able to view lab/test results, encounter notes, upcoming appointments, etc.  Non-urgent messages can be sent to your provider as well.   To learn more about what you can do with MyChart, go to [email or web address].    Your next appointment:    AS NEEDED with Dr. Debara Pickett

## 2022-08-22 ENCOUNTER — Ambulatory Visit: Payer: BC Managed Care – PPO | Admitting: Cardiology

## 2022-09-27 ENCOUNTER — Telehealth: Payer: Self-pay | Admitting: *Deleted

## 2022-09-27 ENCOUNTER — Ambulatory Visit: Payer: Medicare PPO | Attending: Cardiology | Admitting: Cardiology

## 2022-09-27 VITALS — BP 130/90 | HR 75 | Ht 72.0 in | Wt 255.0 lb

## 2022-09-27 DIAGNOSIS — G4733 Obstructive sleep apnea (adult) (pediatric): Secondary | ICD-10-CM | POA: Diagnosis not present

## 2022-09-27 DIAGNOSIS — I10 Essential (primary) hypertension: Secondary | ICD-10-CM | POA: Diagnosis not present
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
## 2022-09-27 NOTE — Telephone Encounter (Signed)
-----   Message from Joni Reining, RN sent at 09/27/2022 11:50 AM EDT ----- Regarding: DME Per Dr. Radford Pax-  "Please send a message to [facility] to call patient's DME and tell them that she absolutely has to have a new cushion every month so they need to send her enough cushions quarterly that she can change the cushion now every 4 weeks."  Patricia Davidson

## 2022-09-27 NOTE — Telephone Encounter (Signed)
First, the patient should call her insurance carrier to get her supply frequency increased, then her insurance provider should send communication to the provider to write an updated Rx for the patient to received the new amount of supplies. Then the the providers will send the new Rx to the dme to be filled for the patient.

## 2022-09-27 NOTE — Patient Instructions (Signed)
Medication Instructions:  Your physician recommends that you continue on your current medications as directed. Please refer to the Current Medication list given to you today.  *If you need a refill on your cardiac medications before your next appointment, please call your pharmacy*   Lab Work: None.  If you have labs (blood work) drawn today and your tests are completely normal, you will receive your results only by: [place] (if you have MyChart) OR A paper copy in the mail If you have any lab test that is abnormal or we need to change your treatment, we will call you to review the results.   Testing/Procedures: None.   Follow-Up:  Your next VIRTUAL appointment:   3 month(s)  Provider:   Dr. Fransico Him    Other Instructions Dr. Radford Pax has ordered monthly nasal cushions for you. Someone from your DME company or our sleep team will reach out to you regarding getting this equipment to you.

## 2022-10-02 ENCOUNTER — Telehealth: Payer: Self-pay

## 2022-10-02 NOTE — Telephone Encounter (Signed)
Called and left detailed message per DPR that Dr. Mayford Knife would like patient scheduled for 3 month follow up.

## 2022-10-03 ENCOUNTER — Ambulatory Visit: Payer: Medicare PPO | Admitting: Emergency Medicine

## 2022-10-03 ENCOUNTER — Encounter: Payer: Self-pay | Admitting: Emergency Medicine

## 2022-10-03 VITALS — BP 132/74 | HR 76 | Temp 98.3°F | Ht 72.0 in | Wt 269.1 lb

## 2022-10-03 DIAGNOSIS — G894 Chronic pain syndrome: Secondary | ICD-10-CM

## 2022-10-03 DIAGNOSIS — Z1211 Encounter for screening for malignant neoplasm of colon: Secondary | ICD-10-CM

## 2022-10-03 DIAGNOSIS — Z13820 Encounter for screening for osteoporosis: Secondary | ICD-10-CM

## 2022-10-03 DIAGNOSIS — I10 Essential (primary) hypertension: Secondary | ICD-10-CM

## 2022-10-03 DIAGNOSIS — E785 Hyperlipidemia, unspecified: Secondary | ICD-10-CM | POA: Diagnosis not present

## 2022-10-03 DIAGNOSIS — I700 Atherosclerosis of aorta: Secondary | ICD-10-CM

## 2022-10-03 NOTE — Assessment & Plan Note (Signed)
Well-controlled hypertension. Continue atenolol 50 mg daily, hydrochlorothiazide 12.5 mg daily Cardiovascular risks associated with hypertension discussed Dietary approaches to stop hypertension discussed
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
## 2022-10-03 NOTE — Patient Instructions (Signed)
Hypertension, Adult High blood pressure (hypertension) is when the force of blood pumping through the arteries is too strong. The arteries are the blood vessels that carry blood from the heart throughout the body. Hypertension forces the heart to work harder to pump blood and may cause arteries to become narrow or stiff. Untreated or uncontrolled hypertension can lead to a heart attack, heart failure, a stroke, kidney disease, and other problems. A blood pressure reading consists of a higher number over a lower number. Ideally, your blood pressure should be below 120/80. The first ("top") number is called the systolic pressure. It is a measure of the pressure in your arteries as your heart beats. The second ("bottom") number is called the diastolic pressure. It is a measure of the pressure in your arteries as the heart relaxes. What are the causes? The exact cause of this condition is not known. There are some conditions that result in high blood pressure. What increases the risk? Certain factors may make you more likely to develop high blood pressure. Some of these risk factors are under your control, including: Smoking. Not getting enough exercise or physical activity. Being overweight. Having too much fat, sugar, calories, or salt (sodium) in your diet. Drinking too much alcohol. Other risk factors include: Having a personal history of heart disease, diabetes, high cholesterol, or kidney disease. Stress. Having a family history of high blood pressure and high cholesterol. Having obstructive sleep apnea. Age. The risk increases with age. What are the signs or symptoms? High blood pressure may not cause symptoms. Very high blood pressure (hypertensive crisis) may cause: Headache. Fast or irregular heartbeats (palpitations). Shortness of breath. Nosebleed. Nausea and vomiting. Vision changes. Severe chest pain, dizziness, and seizures. How is this diagnosed? This condition is diagnosed by  measuring your blood pressure while you are seated, with your arm resting on a flat surface, your legs uncrossed, and your feet flat on the floor. The cuff of the blood pressure monitor will be placed directly against the skin of your upper arm at the level of your heart. Blood pressure should be measured at least twice using the same arm. Certain conditions can cause a difference in blood pressure between your right and left arms. If you have a high blood pressure reading during one visit or you have normal blood pressure with other risk factors, you may be asked to: Return on a different day to have your blood pressure checked again. Monitor your blood pressure at home for 1 week or longer. If you are diagnosed with hypertension, you may have other blood or imaging tests to help your health care provider understand your overall risk for other conditions. How is this treated? This condition is treated by making healthy lifestyle changes, such as eating healthy foods, exercising more, and reducing your alcohol intake. You may be referred for counseling on a healthy diet and physical activity. Your health care provider may prescribe medicine if lifestyle changes are not enough to get your blood pressure under control and if: Your systolic blood pressure is above 130. Your diastolic blood pressure is above 80. Your personal target blood pressure may vary depending on your medical conditions, your age, and other factors. Follow these instructions at home: Eating and drinking  Eat a diet that is high in fiber and potassium, and low in sodium, added sugar, and fat. An example of this eating plan is called the DASH diet. DASH stands for Dietary Approaches to Stop Hypertension. To eat this way: Eat   plenty of fresh fruits and vegetables. Try to fill one half of your plate at each meal with fruits and vegetables. Eat whole grains, such as whole-wheat pasta, brown rice, or whole-grain bread. Fill about one  fourth of your plate with whole grains. Eat or drink low-fat dairy products, such as skim milk or low-fat yogurt. Avoid fatty cuts of meat, processed or cured meats, and poultry with skin. Fill about one fourth of your plate with lean proteins, such as fish, chicken without skin, beans, eggs, or tofu. Avoid pre-made and processed foods. These tend to be higher in sodium, added sugar, and fat. Reduce your daily sodium intake. Many people with hypertension should eat less than 1,500 mg of sodium a day. Do not drink alcohol if: Your health care provider tells you not to drink. You are pregnant, may be pregnant, or are planning to become pregnant. If you drink alcohol: Limit how much you have to: 0-1 drink a day for women. 0-2 drinks a day for men. Know how much alcohol is in your drink. In the [place] one drink equals one 12 oz bottle of beer (355 mL), one 5 oz glass of wine (148 mL), or one 1 oz glass of hard liquor (44 mL). Lifestyle  Work with your health care provider to maintain a healthy body weight or to lose weight. Ask what an ideal weight is for you. Get at least 30 minutes of exercise that causes your heart to beat faster (aerobic exercise) most days of the week. Activities may include walking, swimming, or biking. Include exercise to strengthen your muscles (resistance exercise), such as Pilates or lifting weights, as part of your weekly exercise routine. Try to do these types of exercises for 30 minutes at least 3 days a week. Do not use any products that contain nicotine or tobacco. These products include cigarettes, chewing tobacco, and vaping devices, such as e-cigarettes. If you need help quitting, ask your health care provider. Monitor your blood pressure at home as told by your health care provider. Keep all follow-up visits. This is important. Medicines Take over-the-counter and prescription medicines only as told by your health care provider. Follow directions carefully. Blood  pressure medicines must be taken as prescribed. Do not skip doses of blood pressure medicine. Doing this puts you at risk for problems and can make the medicine less effective. Ask your health care provider about side effects or reactions to medicines that you should watch for. Contact a health care provider if you: Think you are having a reaction to a medicine you are taking. Have headaches that keep coming back (recurring). Feel dizzy. Have swelling in your ankles. Have trouble with your vision. Get help right away if you: Develop a severe headache or confusion. Have unusual weakness or numbness. Feel faint. Have severe pain in your chest or abdomen. Vomit repeatedly. Have trouble breathing. These symptoms may be an emergency. Get help right away. Call 911. Do not wait to see if the symptoms will go away. Do not drive yourself to the hospital. Summary Hypertension is when the force of blood pumping through your arteries is too strong. If this condition is not controlled, it may put you at risk for serious complications. Your personal target blood pressure may vary depending on your medical conditions, your age, and other factors. For most people, a normal blood pressure is less than 120/80. Hypertension is treated with lifestyle changes, medicines, or a combination of both. Lifestyle changes include losing weight, eating a healthy,   low-sodium diet, exercising more, and limiting alcohol. This information is not intended to replace advice given to you by your health care provider. Make sure you discuss any questions you have with your health care provider. Document Revised: 04/18/2021 Document Reviewed: 04/18/2021 Elsevier Patient Education  2023 Elsevier Inc.  

## 2022-10-03 NOTE — Assessment & Plan Note (Signed)
Stable and well-controlled.  Opiate dependent. On daily morphine and hydrocodone for breakthrough pain episodes Also on Cymbalta 30 mg daily Follows up with orthopedist on a regular basis

## 2022-10-03 NOTE — Assessment & Plan Note (Signed)
Continues atorvastatin 80 mg daily and Zetia 10 mg daily Sees cardiologist and lipid clinic on a regular basis. Diet and nutrition discussed

## 2022-10-03 NOTE — Assessment & Plan Note (Signed)
Diet and nutrition discussed. Continues atorvastatin 80 mg daily and Zetia 10 mg daily

## 2022-10-11 ENCOUNTER — Telehealth: Payer: Self-pay | Admitting: Gastroenterology

## 2022-10-11 NOTE — Telephone Encounter (Signed)
Hi Dr. Barron Alvine,   Supervising Provider: 10/11/22-PM  We received a referral for patient to have a colonoscopy done, she is requesting a transfer of care over to [facility] if possible. Her records from [facility] were obtained and scanned into Media for you to review and advise on scheduling.    Thanks

## 2022-10-17 ENCOUNTER — Ambulatory Visit: Payer: Medicare PPO | Attending: Orthopedic Surgery

## 2022-10-17 ENCOUNTER — Other Ambulatory Visit: Payer: Self-pay

## 2022-10-17 DIAGNOSIS — M5459 Other low back pain: Secondary | ICD-10-CM | POA: Insufficient documentation

## 2022-10-17 DIAGNOSIS — M6281 Muscle weakness (generalized): Secondary | ICD-10-CM | POA: Diagnosis present

## 2022-10-17 DIAGNOSIS — R2689 Other abnormalities of gait and mobility: Secondary | ICD-10-CM | POA: Insufficient documentation

## 2022-10-17 NOTE — Telephone Encounter (Signed)
Called patient to schedule left voicemail. 
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
## 2022-10-23 ENCOUNTER — Other Ambulatory Visit: Payer: Medicare PPO

## 2022-10-25 ENCOUNTER — Ambulatory Visit (INDEPENDENT_AMBULATORY_CARE_PROVIDER_SITE_OTHER)
Admission: RE | Admit: 2022-10-25 | Discharge: 2022-10-25 | Disposition: A | Discharge status: Home / Self Care | Payer: Medicare PPO | Source: Ambulatory Visit | Attending: Emergency Medicine | Admitting: Emergency Medicine

## 2022-10-25 DIAGNOSIS — Z13820 Encounter for screening for osteoporosis: Secondary | ICD-10-CM

## 2022-10-26 NOTE — Progress Notes (Unsigned)
Cardiology Office Note:    Date:  10/29/2022   ID:  [name], DOB [birth year], MRN [id number]  PCP:  Georgina Quint, MD   [facility] Providers Cardiologist:  Alverda Skeans, MD Referring MD: Georgina Quint, *   Chief Complaint/Reason for Referral: Palpitations  ASSESSMENT:    1. Palpitations   2. Elevated lipoprotein(a)   3. Essential hypertension   4. Aortic atherosclerosis (HCC)   5. OSA (obstructive sleep apnea)   6. BMI 36.0-36.9,adult      PLAN:    In order of problems listed above: 1.  Palpitations: The patient lost her monitor while she was moving.  She has the monitor with her.  We will see if we can use this otherwise we will obtain another monitor. 2.  Elevated LP(a): Recent LDL was 58 which is close to goal of less than 55.  Continue current therapy. 3.  Hypertension: Blood pressure is well-controlled on her current regimen. 4.  Aortic atherosclerosis: Continue aspirin, statin, and strict blood pressure control. 5.  Obstructive sleep apnea on CPAP: Followed by Dr. Mayford Knife. 6.  Elevated BMI: This is an issue given her chronic back pain.  She is seeing orthopedics and they are is discussion about steroid injection versus back surgery.             Dispo:  No follow-ups on file.      Medication Adjustments/Labs and Tests Ordered: Current medicines are reviewed at length with the patient today.  Concerns regarding medicines are outlined above.  The following changes have been made:     Labs/tests ordered: No orders of the defined types were placed in this encounter.   Medication Changes: No orders of the defined types were placed in this encounter.    Current medicines are reviewed at length with the patient today.  The patient does not have concerns regarding medicines.   History of Present Illness:    FOCUSED PROBLEM LIST:   1.  Hyperlipidemia; elevated LP(a) 2.  Hypertension 3.  Prior COVID-19 infection 4.  Aortic  atherosclerosis on renal CT 2020 5.  Prior cardiovascular evaluation at [facility] 20 years ago for palpitations including reassuring coronary angiography and Holter monitor 6.  BMI 36 7.  OSA on CPAP  July 2023:  The patient is a 67 y.o. female with the indicated medical history here for recommendations regarding palpitations.  Laboratories were drawn which were unremarkable including a normal TSH.  She tells me she has had palpitations at times.  Maybe once every few weeks she will have palpitations that last a few hours.  Occasionally she will have extra beats that are bothersome and not associate with chest pain or shortness of breath.  She has not had any significant presyncope or syncope.  She denies any paroxysmal nocturnal dyspnea, or orthopnea.  She denies any peripheral edema.  She has not required any emergency room visits or hospitalizations.  She did have an extensive work-up about 20 years ago for palpitations at [facility] that included an echocardiogram, Holter monitor and coronary angiography study.  She tells me all of these studies were reassuring.  She is also diagnosed with obstructive sleep apnea prior to the pandemic.  She was prescribed CPAP but then her sleep medicine doctor retired.  She has been using a CPAP machine but is not being actively managed.  Plan:  Echocardiogram, monitor, increase atorvastatin to 80 mg and check lipid panel, LFTs, LP(a) in 2 months; refer to sleep medicine.  Today:  Echocardiogram was done which demonstrated reassuring findings.  Monitor was started however it was never returned.  Her LDL was above goal.  She was started on Zetia 10 mg and referred to Dr. Rennis Golden due to elevated LP(a).  Repeat LDL was 58.  She was seen in our office last November for preoperative evaluation prior to orthopedic surgery.  She continues to do well from a cardiovascular perspective.  She denies any chest pain.  She has noticed some palpitations that improved in  frequency but still present at times.  She was unable to wear her monitor because it was misplaced when she was moving.  She has a monitor unit with her today.  She has not yet worn it.  She has been unable to lose a significant amount of weight because she is limited by low back pain.  She is seeing orthopedics regarding this issue.  She fortunately is not required any hospitalizations or emergency room visits for cardiac issues.  Current Medications: Current Meds  Medication Sig   amphetamine-dextroamphetamine (ADDERALL) 15 MG tablet Take 15 mg by mouth daily.   aspirin EC 81 MG tablet Take 81 mg by mouth at bedtime. Swallow whole.   atenolol (TENORMIN) 50 MG tablet Take 50 mg by mouth at bedtime.    atorvastatin (LIPITOR) 80 MG tablet Take 1 tablet (80 mg total) by mouth daily.   DULoxetine (CYMBALTA) 30 MG capsule Take 90 mg by mouth at bedtime.   ezetimibe (ZETIA) 10 MG tablet Take 1 tablet (10 mg total) by mouth daily.   hydrochlorothiazide (MICROZIDE) 12.5 MG capsule Take 12.5 mg by mouth at bedtime.    HYDROcodone-acetaminophen (NORCO) 10-325 MG tablet Take 2 tablets by mouth every 6 (six) hours as needed for severe pain or moderate pain.   morphine (MS CONTIN) 60 MG 12 hr tablet Take 60 mg by mouth every 12 (twelve) hours.   oxymetazoline (AFRIN) 0.05 % nasal spray Place 2 sprays into both nostrils as needed for congestion.   predniSONE (DELTASONE) 5 MG tablet Take 5 mg by mouth as needed.     Allergies:    Sulfa antibiotics and Sulfamethoxazole   Social History:   Social History   Tobacco Use   Smoking status: Never   Smokeless tobacco: Never  Vaping Use   Vaping Use: Never used  Substance Use Topics   Alcohol use: Yes    Comment: rare occ wine   Drug use: No     Family Hx: Family History  Problem Relation Age of Onset   Heart disease Mother    Heart disease Father    Stroke Father      Review of Systems:   Please see the history of present illness.    All other  systems reviewed and are negative.     EKGs/Labs/Other Test Reviewed:    EKG:  EKG performed June 2022 that I personally reviewed demonstrates sinus rhythm; EKG performed today that I personally reviewed demonstrates sinus rhythm.  Prior CV studies:  TTE 9/23:  1. Left ventricular ejection fraction, by estimation, is 55 to 60%. Left  ventricular ejection fraction by 3D volume is 57 %. The left ventricle has  normal function. The left ventricle has no regional wall motion  abnormalities. Left ventricular diastolic   parameters are consistent with Grade I diastolic dysfunction (impaired  relaxation).   2. Right ventricular systolic function is normal. The right ventricular  size is normal. There is normal pulmonary artery systolic pressure.   3. The  mitral valve is normal in structure. Mild mitral valve  regurgitation. No evidence of mitral stenosis.   4. The aortic valve is normal in structure. Aortic valve regurgitation is  not visualized. No aortic stenosis is present.   5. The inferior vena cava is normal in size with greater than 50%  respiratory variability, suggesting right atrial pressure of 3 mmHg.   Other studies Reviewed: Review of the additional studies/records demonstrates: CT abdomen pelvis renal stone study 2022 with aortic atherosclerosis; chest CT 2019 without coronary artery calcification  Recent Labs: 01/01/2022: TSH 2.21 03/06/2022: ALT 18 07/02/2022: BUN 15; Creatinine, Ser 0.45; Hemoglobin 10.6; Platelets 256; Potassium 3.8; Sodium 139   Recent Lipid Panel Lab Results  Component Value Date/Time   CHOL 132 05/07/2022 10:44 AM   TRIG 104 05/07/2022 10:44 AM   HDL 55 05/07/2022 10:44 AM   LDLCALC 58 05/07/2022 10:44 AM    Risk Assessment/Calculations:           Physical Exam:    VS:  BP 125/65   Pulse 74   Ht 5\' 11"  (1.803 m)   Wt 267 lb 6.4 oz (121.3 kg)   SpO2 96%   BMI 37.29 kg/m       Wt Readings from Last 3 Encounters:  10/29/22 267 lb 6.4  oz (121.3 kg)  10/03/22 269 lb 2 oz (122.1 kg)  09/27/22 255 lb (115.7 kg)    GENERAL:  No apparent distress, AOx3 HEENT:  No carotid bruits, +2 carotid impulses, no scleral icterus CAR: RRR no murmurs, gallops, rubs, or thrills RES:  Clear to auscultation bilaterally ABD:  Soft, nontender, nondistended, positive bowel sounds x 4 VASC:  +2 radial pulses, +2 carotid pulses, palpable pedal pulses NEURO:  CN 2-12 grossly intact; motor and sensory grossly intact PSYCH:  No active depression or anxiety EXT:  No edema, ecchymosis, or cyanosis  Signed, Orbie Pyo, MD  10/29/2022 10:58 AM    [facility] [address] [place], [place], [place]  [address] Phone: [phone]; Fax: [phone]   Note:  This document was prepared using Dragon voice recognition software and may include unintentional dictation errors.

## 2022-10-29 ENCOUNTER — Ambulatory Visit: Payer: Medicare PPO | Attending: Internal Medicine | Admitting: Internal Medicine

## 2022-10-29 ENCOUNTER — Encounter: Payer: Self-pay | Admitting: Internal Medicine

## 2022-10-29 ENCOUNTER — Ambulatory Visit (INDEPENDENT_AMBULATORY_CARE_PROVIDER_SITE_OTHER): Payer: Medicare PPO

## 2022-10-29 ENCOUNTER — Other Ambulatory Visit: Payer: Self-pay | Admitting: *Deleted

## 2022-10-29 VITALS — BP 125/65 | HR 74 | Ht 71.0 in | Wt 267.4 lb

## 2022-10-29 DIAGNOSIS — I10 Essential (primary) hypertension: Secondary | ICD-10-CM | POA: Diagnosis not present

## 2022-10-29 DIAGNOSIS — Z6836 Body mass index (BMI) 36.0-36.9, adult: Secondary | ICD-10-CM

## 2022-10-29 DIAGNOSIS — E7841 Elevated Lipoprotein(a): Secondary | ICD-10-CM

## 2022-10-29 DIAGNOSIS — R002 Palpitations: Secondary | ICD-10-CM

## 2022-10-29 DIAGNOSIS — I700 Atherosclerosis of aorta: Secondary | ICD-10-CM | POA: Diagnosis not present

## 2022-10-29 DIAGNOSIS — G4733 Obstructive sleep apnea (adult) (pediatric): Secondary | ICD-10-CM

## 2022-10-29 NOTE — Progress Notes (Unsigned)
[id number] ZIO XT from office inventory applied to patient.

## 2022-10-29 NOTE — Patient Instructions (Signed)
Medication Instructions:  Your physician recommends that you continue on your current medications as directed. Please refer to the Current Medication list given to you today.  *If you need a refill on your cardiac medications before your next appointment, please call your pharmacy*   Testing/Procedures: Your physician has recommended that you wear an event monitor. Event monitors are medical devices that record the heart's electrical activity. Doctors most often [place] these monitors to diagnose arrhythmias. Arrhythmias are problems with the speed or rhythm of the heartbeat. The monitor is a small, portable device. You can wear one while you do your normal daily activities. This is usually used to diagnose what is causing palpitations/syncope (passing out).    Follow-Up: At [facility], you and your health needs are our priority.  As part of our continuing mission to provide you with exceptional heart care, we have created designated Provider Care Teams.  These Care Teams include your primary Cardiologist (physician) and Advanced Practice Providers (APPs -  Physician Assistants and Nurse Practitioners) who all work together to provide you with the care you need, when you need it.  Your next appointment:   1 year(s)  Provider:   Orbie Pyo, MD     Other Instructions Patricia Davidson- Long Term Monitor Instructions  Your physician has requested you wear a ZIO patch monitor for 7 days.  This is a single patch monitor. [employer] supplies one patch monitor per enrollment. Additional stickers are not available. Please do not apply patch if you will be having a Nuclear Stress Test,  Echocardiogram, Cardiac CT, MRI, or Chest Xray during the period you would be wearing the  monitor. The patch cannot be worn during these tests. You cannot remove and re-apply the  ZIO XT patch monitor.  Your ZIO patch monitor will be mailed 3 day USPS to your address on file. It may take 3-5 days  to receive your  monitor after you have been enrolled.  Once you have received your monitor, please review the enclosed instructions. Your monitor  has already been registered assigning a specific monitor serial # to you.  Billing and Patient Assistance Program Information  We have supplied [employer] with any of your insurance information on file for billing purposes. [employer] offers a sliding scale Patient Assistance Program for patients that do not have  insurance, or whose insurance does not completely cover the cost of the ZIO monitor.  You must apply for the Patient Assistance Program to qualify for this discounted rate.  To apply, please call [employer] at [phone], select option 4, select option 2, ask to apply for  Patient Assistance Program. Patricia Davidson will ask your household income, and how many people  are in your household. They will quote your out-of-pocket cost based on that information.  [employer] will also be able to set up a 75-month, interest-free payment plan if needed.  Applying the monitor   Shave hair from upper left chest.  Hold abrader disc by orange tab. Rub abrader in 40 strokes over the upper left chest as  indicated in your monitor instructions.  Clean area with 4 enclosed alcohol pads. Let dry.  Apply patch as indicated in monitor instructions. Patch will be placed under collarbone on left  side of chest with arrow pointing upward.  Rub patch adhesive wings for 2 minutes. Remove white label marked "1". Remove the white  label marked "2". Rub patch adhesive wings for 2 additional minutes.  While looking in a mirror, press and release  button in center of patch. A small green light will  flash 3-4 times. This will be your only indicator that the monitor has been turned on.  Do not shower for the first 24 hours. You may shower after the first 24 hours.  Press the button if you feel a symptom. You will hear a small click. Record Date, Time and  Symptom in the Patient Logbook.  When you  are ready to remove the patch, follow instructions on the last 2 pages of Patient  Logbook. Stick patch monitor onto the last page of Patient Logbook.  Place Patient Logbook in the blue and white box. Use locking tab on box and tape box closed  securely. The blue and white box has prepaid postage on it. Please place it in the mailbox as  soon as possible. Your physician should have your test results approximately 7 days after the  monitor has been mailed back to [facility].  Call [facility] Customer Care at [phone] if you have questions regarding  your ZIO XT patch monitor. Call them immediately if you see an orange light blinking on your  monitor.  If your monitor falls off in less than 4 days, contact our Monitor department at [phone].  If your monitor becomes loose or falls off after 4 days call [employer] at [phone] for  suggestions on securing your monitor
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
## 2022-10-31 ENCOUNTER — Ambulatory Visit: Payer: Medicare PPO | Attending: Orthopedic Surgery

## 2022-10-31 DIAGNOSIS — R2689 Other abnormalities of gait and mobility: Secondary | ICD-10-CM

## 2022-10-31 DIAGNOSIS — M5459 Other low back pain: Secondary | ICD-10-CM

## 2022-10-31 DIAGNOSIS — M6281 Muscle weakness (generalized): Secondary | ICD-10-CM | POA: Diagnosis present
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
## 2022-11-02 ENCOUNTER — Ambulatory Visit: Payer: Medicare PPO

## 2022-11-02 ENCOUNTER — Telehealth: Payer: Self-pay | Admitting: Emergency Medicine

## 2022-11-02 DIAGNOSIS — R2689 Other abnormalities of gait and mobility: Secondary | ICD-10-CM

## 2022-11-02 DIAGNOSIS — M5459 Other low back pain: Secondary | ICD-10-CM

## 2022-11-02 DIAGNOSIS — M6281 Muscle weakness (generalized): Secondary | ICD-10-CM

## 2022-11-02 NOTE — Telephone Encounter (Signed)
Pt stated that the gastro stated that she don't need a colonoscopy pt told gastro that her primary care wanted her to get done due to her results. Please advise.

## 2022-11-02 NOTE — Telephone Encounter (Signed)
Called GI for clarifications on colonoscopy. GI stated patient needed an OV before a colonoscopy can be scheduled. They was stating that she needed to have positive GI symptoms. If she has no positive GI symptoms even with the positive cologuard she will wait until 2027 to get her colonoscopy done that is when it is due per her previous provider. Patient states she will decide if she needs to call for an appt

## 2022-11-04 ENCOUNTER — Other Ambulatory Visit: Payer: Self-pay | Admitting: Emergency Medicine

## 2022-11-04 DIAGNOSIS — R195 Other fecal abnormalities: Secondary | ICD-10-CM

## 2022-11-04 NOTE — Telephone Encounter (Signed)
My understanding is that a positive Cologuard test is an indication for colonoscopy.  That is why this is a screening test.  I will place a referral to a different GI doctor today.  Thanks.

## 2022-11-05 NOTE — Telephone Encounter (Signed)
Called patient to inform her that a new referral to gastro has been placed.

## 2022-11-07 ENCOUNTER — Ambulatory Visit: Payer: Medicare PPO

## 2022-11-07 DIAGNOSIS — M6281 Muscle weakness (generalized): Secondary | ICD-10-CM

## 2022-11-07 DIAGNOSIS — M5459 Other low back pain: Secondary | ICD-10-CM | POA: Diagnosis not present
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
## 2022-11-07 NOTE — Telephone Encounter (Signed)
Patient needs to talk to you about new referral - same problem, different place.  Please call:  [phone]

## 2022-11-08 NOTE — Telephone Encounter (Signed)
Referral was sent to Dr. Elnoria Howard office. Patient states that office needed OV notes and last colonoscopy before she will be scheduled for an OV. Patient states she will call [facility] to schedule an appt
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
## 2022-11-09 ENCOUNTER — Ambulatory Visit: Payer: Medicare PPO

## 2022-11-09 ENCOUNTER — Encounter: Payer: Self-pay | Admitting: Gastroenterology

## 2022-11-09 DIAGNOSIS — M5459 Other low back pain: Secondary | ICD-10-CM | POA: Diagnosis not present

## 2022-11-09 DIAGNOSIS — M6281 Muscle weakness (generalized): Secondary | ICD-10-CM
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
## 2022-11-14 ENCOUNTER — Encounter: Payer: Self-pay | Admitting: Physical Therapy

## 2022-11-14 ENCOUNTER — Ambulatory Visit: Payer: Medicare PPO | Admitting: Physical Therapy

## 2022-11-14 DIAGNOSIS — M5459 Other low back pain: Secondary | ICD-10-CM

## 2022-11-14 DIAGNOSIS — M6281 Muscle weakness (generalized): Secondary | ICD-10-CM

## 2022-11-14 DIAGNOSIS — R2689 Other abnormalities of gait and mobility: Secondary | ICD-10-CM

## 2022-11-16 ENCOUNTER — Ambulatory Visit: Payer: Medicare PPO

## 2022-11-16 DIAGNOSIS — M5459 Other low back pain: Secondary | ICD-10-CM

## 2022-11-16 DIAGNOSIS — R2689 Other abnormalities of gait and mobility: Secondary | ICD-10-CM

## 2022-11-16 DIAGNOSIS — M6281 Muscle weakness (generalized): Secondary | ICD-10-CM
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
## 2022-11-21 ENCOUNTER — Ambulatory Visit: Payer: Medicare PPO

## 2022-11-21 DIAGNOSIS — M5459 Other low back pain: Secondary | ICD-10-CM | POA: Diagnosis not present

## 2022-11-21 DIAGNOSIS — M6281 Muscle weakness (generalized): Secondary | ICD-10-CM
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
## 2022-11-23 ENCOUNTER — Ambulatory Visit: Payer: Medicare PPO

## 2022-11-23 DIAGNOSIS — M5459 Other low back pain: Secondary | ICD-10-CM

## 2022-11-23 DIAGNOSIS — R2689 Other abnormalities of gait and mobility: Secondary | ICD-10-CM

## 2022-11-27 DIAGNOSIS — M5416 Radiculopathy, lumbar region: Secondary | ICD-10-CM | POA: Diagnosis not present
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
## 2022-11-28 ENCOUNTER — Ambulatory Visit: Payer: Medicare PPO | Attending: Orthopedic Surgery

## 2022-11-28 DIAGNOSIS — M5459 Other low back pain: Secondary | ICD-10-CM | POA: Insufficient documentation

## 2022-11-28 DIAGNOSIS — R2689 Other abnormalities of gait and mobility: Secondary | ICD-10-CM | POA: Diagnosis not present

## 2022-11-28 DIAGNOSIS — M6281 Muscle weakness (generalized): Secondary | ICD-10-CM | POA: Diagnosis not present
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
## 2022-11-30 ENCOUNTER — Ambulatory Visit: Payer: Medicare PPO

## 2022-11-30 DIAGNOSIS — M6281 Muscle weakness (generalized): Secondary | ICD-10-CM

## 2022-11-30 DIAGNOSIS — M5459 Other low back pain: Secondary | ICD-10-CM | POA: Diagnosis not present

## 2022-11-30 DIAGNOSIS — R2689 Other abnormalities of gait and mobility: Secondary | ICD-10-CM

## 2022-12-12 DIAGNOSIS — M5451 Vertebrogenic low back pain: Secondary | ICD-10-CM | POA: Diagnosis not present

## 2022-12-17 ENCOUNTER — Ambulatory Visit: Payer: Medicare PPO

## 2022-12-22 DIAGNOSIS — G4733 Obstructive sleep apnea (adult) (pediatric): Secondary | ICD-10-CM | POA: Diagnosis not present

## 2022-12-26 ENCOUNTER — Other Ambulatory Visit: Payer: Self-pay | Admitting: Emergency Medicine

## 2022-12-26 DIAGNOSIS — Z1231 Encounter for screening mammogram for malignant neoplasm of breast: Secondary | ICD-10-CM

## 2022-12-28 ENCOUNTER — Ambulatory Visit: Payer: Medicare PPO

## 2022-12-31 ENCOUNTER — Ambulatory Visit: Payer: Medicare PPO

## 2023-01-07 ENCOUNTER — Ambulatory Visit: Payer: Medicare PPO

## 2023-01-08 ENCOUNTER — Other Ambulatory Visit: Payer: Self-pay

## 2023-01-08 MED ORDER — ATORVASTATIN CALCIUM 80 MG PO TABS: 80.0000 mg | ORAL_TABLET | Freq: Every day | ORAL | 3 refills | Status: DC
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
## 2023-01-09 ENCOUNTER — Telehealth: Payer: Self-pay | Admitting: *Deleted

## 2023-01-09 ENCOUNTER — Ambulatory Visit: Payer: Medicare PPO | Attending: Cardiology | Admitting: Cardiology

## 2023-01-09 ENCOUNTER — Encounter: Payer: Self-pay | Admitting: Cardiology

## 2023-01-09 ENCOUNTER — Telehealth: Payer: Self-pay

## 2023-01-09 VITALS — Ht 71.0 in

## 2023-01-09 DIAGNOSIS — E7841 Elevated Lipoprotein(a): Secondary | ICD-10-CM

## 2023-01-09 DIAGNOSIS — G4733 Obstructive sleep apnea (adult) (pediatric): Secondary | ICD-10-CM | POA: Diagnosis not present

## 2023-01-09 DIAGNOSIS — I10 Essential (primary) hypertension: Secondary | ICD-10-CM | POA: Diagnosis not present

## 2023-01-09 NOTE — Patient Instructions (Signed)
Medication Instructions:  Your physician recommends that you continue on your current medications as directed. Please refer to the Current Medication list given to you today.  *If you need a refill on your cardiac medications before your next appointment, please call your pharmacy*   Lab Work: None.  If you have labs (blood work) drawn today and your tests are completely normal, you will receive your results only by: MyChart Message (if you have MyChart) OR A paper copy in the mail If you have any lab test that is abnormal or we need to change your treatment, we will call you to review the results.   Testing/Procedures: None.   Follow-Up: At [facility], you and your health needs are our priority.  As part of our continuing mission to provide you with exceptional heart care, we have created designated Provider Care Teams.  These Care Teams include your primary Cardiologist (physician) and Advanced Practice Providers (APPs -  Physician Assistants and Nurse Practitioners) who all work together to provide you with the care you need, when you need it.  We recommend signing up for the patient portal called "MyChart".  Sign up information is provided on this After Visit Summary.  MyChart is used to connect with patients for Virtual Visits (Telemedicine).  Patients are able to view lab/test results, encounter notes, upcoming appointments, etc.  Non-urgent messages can be sent to your provider as well.   To learn more about what you can do with MyChart, go to [email or web address].    Your next appointment:   3 month(s)  Provider:   Dr. Traci Turner, MD   

## 2023-01-09 NOTE — Telephone Encounter (Signed)
Per DrTurner,   order an under the nose FFM     Order placed to [employer] via community message

## 2023-01-09 NOTE — Telephone Encounter (Signed)
Called to schedule 3 month follow up appt per Dr. Mayford Knife, no answer. Left detailed message per DPR asking patient to call our office to schedule follow up.

## 2023-01-13 ENCOUNTER — Other Ambulatory Visit: Payer: Self-pay | Admitting: Emergency Medicine

## 2023-01-14 MED ORDER — HYDROCHLOROTHIAZIDE 12.5 MG PO CAPS: 12.5000 mg | ORAL_CAPSULE | Freq: Every day | ORAL | 3 refills | Status: DC

## 2023-01-14 MED ORDER — ATENOLOL 50 MG PO TABS: 50.0000 mg | ORAL_TABLET | Freq: Every day | ORAL | 3 refills | Status: DC

## 2023-01-21 DIAGNOSIS — G4733 Obstructive sleep apnea (adult) (pediatric): Secondary | ICD-10-CM | POA: Diagnosis not present

## 2023-01-23 DIAGNOSIS — G4733 Obstructive sleep apnea (adult) (pediatric): Secondary | ICD-10-CM | POA: Diagnosis not present

## 2023-01-24 NOTE — Telephone Encounter (Signed)
Pt called in for an update on sleep mask.

## 2023-01-28 ENCOUNTER — Ambulatory Visit: Payer: Medicare PPO

## 2023-01-30 ENCOUNTER — Telehealth: Payer: Self-pay

## 2023-01-30 NOTE — Telephone Encounter (Signed)
Order placed to [employer] via community message.

## 2023-01-30 NOTE — Telephone Encounter (Signed)
Patient states Dr. Mayford Knife wanted her to try a new mask but she is concerned that order was sent to wrong DME company. Patient states her current DME company is [employer].

## 2023-02-06 ENCOUNTER — Ambulatory Visit
Admission: RE | Admit: 2023-02-06 | Discharge: 2023-02-06 | Disposition: A | Discharge status: Home / Self Care | Payer: Medicare PPO | Source: Ambulatory Visit | Attending: Emergency Medicine | Admitting: Emergency Medicine

## 2023-02-06 DIAGNOSIS — Z1231 Encounter for screening mammogram for malignant neoplasm of breast: Secondary | ICD-10-CM | POA: Diagnosis not present

## 2023-02-07 ENCOUNTER — Encounter: Payer: Self-pay | Admitting: Gastroenterology

## 2023-02-07 ENCOUNTER — Ambulatory Visit: Payer: Medicare PPO | Admitting: Gastroenterology

## 2023-02-07 VITALS — BP 140/70 | HR 80 | Ht 71.0 in | Wt 272.0 lb

## 2023-02-07 DIAGNOSIS — K649 Unspecified hemorrhoids: Secondary | ICD-10-CM | POA: Diagnosis not present

## 2023-02-07 DIAGNOSIS — R195 Other fecal abnormalities: Secondary | ICD-10-CM

## 2023-02-07 MED ORDER — CLENPIQ 10-3.5-12 MG-GM -GM/175ML PO SOLN: 1.0000 | Freq: Once | ORAL | 0 refills | Status: AC
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
## 2023-02-07 NOTE — Patient Instructions (Addendum)
You have been scheduled for a colonoscopy. Please follow written instructions given to you at your visit today.   Please pick up your prep supplies at the pharmacy within the next 1-3 days.  If you use inhalers (even only as needed), please bring them with you on the day of your procedure.  DO NOT TAKE 7 DAYS PRIOR TO TEST- Trulicity (dulaglutide) Ozempic, Wegovy (semaglutide) Mounjaro (tirzepatide) Bydureon Bcise (exanatide extended release)  DO NOT TAKE 1 DAY PRIOR TO YOUR TEST Rybelsus (semaglutide) Adlyxin (lixisenatide) Victoza (liraglutide) Byetta (exanatide) ___________________________________________________________________________  _______________________________________________________  If your blood pressure at your visit was 140/90 or greater, please contact your primary care physician to follow up on this.  _______________________________________________________  If you are age 55 or older, your body mass index should be between 23-30. Your Body mass index is 37.94 kg/m. If this is out of the aforementioned range listed, please consider follow up with your Primary Care Provider.  If you are age 5 or younger, your body mass index should be between 19-25. Your Body mass index is 37.94 kg/m. If this is out of the aformentioned range listed, please consider follow up with your Primary Care Provider.   __________________________________________________________  The [facility] providers would like to encourage you to use [facility] to communicate with providers for non-urgent requests or questions.  Due to long hold times on the telephone, sending your provider a message by [facility] may be a faster and more efficient way to get a response.  Please allow 48 business hours for a response.  Please remember that this is for non-urgent requests.   Due to recent changes in healthcare laws, you may see the results of your imaging and laboratory studies on MyChart before your provider  has had a chance to review them.  We understand that in some cases there may be results that are confusing or concerning to you. Not all laboratory results come back in the same time frame and the provider may be waiting for multiple results in order to interpret others.  Please give [place] 48 hours in order for your provider to thoroughly review all the results before contacting the office for clarification of your results.     Thank you for choosing me and [facility].  Vito Cirigliano, D.O.

## 2023-02-08 ENCOUNTER — Other Ambulatory Visit: Payer: Self-pay | Admitting: Emergency Medicine

## 2023-02-08 DIAGNOSIS — R928 Other abnormal and inconclusive findings on diagnostic imaging of breast: Secondary | ICD-10-CM

## 2023-02-20 ENCOUNTER — Ambulatory Visit: Payer: Medicare PPO

## 2023-02-20 ENCOUNTER — Ambulatory Visit
Admission: RE | Admit: 2023-02-20 | Discharge: 2023-02-20 | Disposition: A | Discharge status: Home / Self Care | Payer: Medicare PPO | Source: Ambulatory Visit | Attending: Emergency Medicine | Admitting: Emergency Medicine

## 2023-02-20 DIAGNOSIS — R928 Other abnormal and inconclusive findings on diagnostic imaging of breast: Secondary | ICD-10-CM | POA: Diagnosis not present

## 2023-02-21 DIAGNOSIS — G4733 Obstructive sleep apnea (adult) (pediatric): Secondary | ICD-10-CM | POA: Diagnosis not present

## 2023-02-28 DIAGNOSIS — G9050 Complex regional pain syndrome I, unspecified: Secondary | ICD-10-CM | POA: Diagnosis not present

## 2023-02-28 DIAGNOSIS — R109 Unspecified abdominal pain: Secondary | ICD-10-CM | POA: Diagnosis not present

## 2023-02-28 DIAGNOSIS — R52 Pain, unspecified: Secondary | ICD-10-CM | POA: Diagnosis not present

## 2023-03-07 ENCOUNTER — Ambulatory Visit (INDEPENDENT_AMBULATORY_CARE_PROVIDER_SITE_OTHER): Payer: Medicare PPO

## 2023-03-07 VITALS — BP 120/70 | Ht 70.0 in | Wt 272.2 lb

## 2023-03-07 DIAGNOSIS — Z0000 Encounter for general adult medical examination without abnormal findings: Secondary | ICD-10-CM | POA: Diagnosis not present
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
## 2023-03-07 NOTE — Patient Instructions (Addendum)
Managing Pain Without Opioids Opioids are strong medicines used to treat moderate to severe pain. For some people, especially those who have long-term (chronic) pain, opioids may not be the best choice for pain management due to: Side effects like nausea, constipation, and sleepiness. The risk of addiction (opioid use disorder). The longer you take opioids, the greater your risk of addiction. Pain that lasts for more than 3 months is called chronic pain. Managing chronic pain usually requires more than one approach and is often provided by a team of health care providers working together (multidisciplinary approach). Pain management may be done at a pain management center or pain clinic. How to manage pain without the use of opioids Use non-opioid medicines Non-opioid medicines for pain may include: Over-the-counter or prescription non-steroidal anti-inflammatory drugs (NSAIDs). These may be the first medicines used for pain. They work well for muscle and bone pain, and they reduce swelling. Acetaminophen. This over-the-counter medicine may work well for milder pain but not swelling. Antidepressants. These may be used to treat chronic pain. A certain type of antidepressant (tricyclics) is often used. These medicines are given in lower doses for pain than when used for depression. Anticonvulsants. These are usually used to treat seizures but may also reduce nerve (neuropathic) pain. Muscle relaxants. These relieve pain caused by sudden muscle tightening (spasms). You may also use a pain medicine that is applied to the skin as a patch, cream, or gel (topical analgesic), such as a numbing medicine. These may cause fewer side effects than medicines taken by mouth. Do certain therapies as directed Some therapies can help with pain management. They include: Physical therapy. You will do exercises to gain strength and flexibility. A physical therapist may teach you exercises to move and stretch parts of  your body that are weak, stiff, or painful. You can learn these exercises at physical therapy visits and practice them at home. Physical therapy may also involve: Massage. Heat wraps or applying heat or cold to affected areas. Electrical signals that interrupt pain signals (transcutaneous electrical nerve stimulation, TENS). Weak lasers that reduce pain and swelling (low-level laser therapy). Signals from your body that help you learn to regulate pain (biofeedback). Occupational therapy. This helps you to learn ways to function at home and work with less pain. Recreational therapy. This involves trying new activities or hobbies, such as a physical activity or drawing. Mental health therapy, including: Cognitive behavioral therapy (CBT). This helps you learn coping skills for dealing with pain. Acceptance and commitment therapy (ACT) to change the way you think and react to pain. Relaxation therapies, including muscle relaxation exercises and mindfulness-based stress reduction. Pain management counseling. This may be individual, family, or group counseling.  Receive medical treatments Medical treatments for pain management include: Nerve block injections. These may include a pain blocker and anti-inflammatory medicines. You may have injections: Near the spine to relieve chronic back or neck pain. Into joints to relieve back or joint pain. Into nerve areas that supply a painful area to relieve body pain. Into muscles (trigger point injections) to relieve some painful muscle conditions. A medical device placed near your spine to help block pain signals and relieve nerve pain or chronic back pain (spinal cord stimulation device). Acupuncture. Follow these instructions at home Medicines Take over-the-counter and prescription medicines only as told by your health care provider. If you are taking pain medicine, ask your health care providers about possible side effects to watch out for. Do not  drive or use heavy machinery  while taking prescription opioid pain medicine. Lifestyle  Do not use drugs or alcohol to reduce pain. If you drink alcohol, limit how much you have to: 0-1 drink a day for women who are not pregnant. 0-2 drinks a day for men. Know how much alcohol is in a drink. In the [place] one drink equals one 12 oz bottle of beer (355 mL), one 5 oz glass of wine (148 mL), or one 1 oz glass of hard liquor (44 mL). Do not use any products that contain nicotine or tobacco. These products include cigarettes, chewing tobacco, and vaping devices, such as e-cigarettes. If you need help quitting, ask your health care provider. Eat a healthy diet and maintain a healthy weight. Poor diet and excess weight may make pain worse. Eat foods that are high in fiber. These include fresh fruits and vegetables, whole grains, and beans. Limit foods that are high in fat and processed sugars, such as fried and sweet foods. Exercise regularly. Exercise lowers stress and may help relieve pain. Ask your health care provider what activities and exercises are safe for you. If your health care provider approves, join an exercise class that combines movement and stress reduction. Examples include yoga and tai chi. Get enough sleep. Lack of sleep may make pain worse. Lower stress as much as possible. Practice stress reduction techniques as told by your therapist. General instructions Work with all your pain management providers to find the treatments that work best for you. You are an important member of your pain management team. There are many things you can do to reduce pain on your own. Consider joining an online or in-person support group for people who have chronic pain. Keep all follow-up visits. This is important. Where to find more information You can find more information about managing pain without opioids from: American Academy of Pain Medicine: painmed.org Institute for Chronic Pain:  instituteforchronicpain.org American Chronic Pain Association: theacpa.org Contact a health care provider if: You have side effects from pain medicine. Your pain gets worse or does not get better with treatments or home therapy. You are struggling with anxiety or depression. Summary Many types of pain can be managed without opioids. Chronic pain may respond better to pain management without opioids. Pain is best managed when you and a team of health care providers work together. Pain management without opioids may include non-opioid medicines, medical treatments, physical therapy, mental health therapy, and lifestyle changes. Tell your health care providers if your pain gets worse or is not being managed well enough. This information is not intended to replace advice given to you by your health care provider. Make sure you discuss any questions you have with your health care provider. Document Revised: 09/21/2020 Document Reviewed: 09/21/2020 Elsevier Patient Education  2024 Elsevier Inc. Ms. Tabler , Thank you for taking time to come for your Medicare Wellness Visit. I appreciate your ongoing commitment to your health goals. Please review the following plan we discussed and let me know if I can assist you in the future.   Referrals/Orders/Follow-Ups/Clinician Recommendations: You are due for Tetanus, Flu, Shingles, and Covid vaccines, which you can get these done at your local pharmacy.  Aim for 30 minutes of exercise or brisk walking, 6-8 glasses of water, and 5 servings of fruits and vegetables each day. Keep up the good work.   This is a list of the screening recommended for you and due dates:  Health Maintenance  Topic Date Due   Colon Cancer Screening  Never done   Zoster (Shingles) Vaccine (1 of 2) Never done   Flu Shot  01/24/2023   COVID-19 Vaccine (1 - 2023-24 season) Never done   Medicare Annual Wellness Visit  03/06/2024   Mammogram  02/05/2025   Pneumonia Vaccine  Completed    DEXA scan (bone density measurement)  Completed   Hepatitis C Screening  Completed   HPV Vaccine  Aged Out   DTaP/Tdap/Td vaccine  Discontinued    Advanced directives: (Provided) Advance directive discussed with you today. I have provided a copy for you to complete at home and have notarized. Once this is complete, please bring a copy in to our office so we can scan it into your chart.   Next Medicare Annual Wellness Visit scheduled for next year: Yes

## 2023-03-08 DIAGNOSIS — N3020 Other chronic cystitis without hematuria: Secondary | ICD-10-CM | POA: Diagnosis not present

## 2023-03-08 DIAGNOSIS — N393 Stress incontinence (female) (male): Secondary | ICD-10-CM | POA: Diagnosis not present

## 2023-03-08 DIAGNOSIS — N200 Calculus of kidney: Secondary | ICD-10-CM | POA: Diagnosis not present

## 2023-03-20 ENCOUNTER — Other Ambulatory Visit: Payer: Self-pay | Admitting: Internal Medicine

## 2023-03-24 DIAGNOSIS — G4733 Obstructive sleep apnea (adult) (pediatric): Secondary | ICD-10-CM | POA: Diagnosis not present

## 2023-04-01 ENCOUNTER — Encounter: Payer: Medicare PPO | Admitting: Gastroenterology

## 2023-04-04 ENCOUNTER — Other Ambulatory Visit: Payer: Self-pay | Admitting: Emergency Medicine

## 2023-04-04 ENCOUNTER — Telehealth: Payer: Self-pay | Admitting: Emergency Medicine

## 2023-04-04 ENCOUNTER — Ambulatory Visit: Payer: Medicare PPO | Admitting: Emergency Medicine

## 2023-04-04 ENCOUNTER — Encounter: Payer: Self-pay | Admitting: Emergency Medicine

## 2023-04-04 VITALS — BP 120/86 | HR 75 | Temp 98.5°F | Ht 70.0 in | Wt 269.4 lb

## 2023-04-04 DIAGNOSIS — E1169 Type 2 diabetes mellitus with other specified complication: Secondary | ICD-10-CM | POA: Insufficient documentation

## 2023-04-04 DIAGNOSIS — I700 Atherosclerosis of aorta: Secondary | ICD-10-CM

## 2023-04-04 DIAGNOSIS — G894 Chronic pain syndrome: Secondary | ICD-10-CM

## 2023-04-04 DIAGNOSIS — E785 Hyperlipidemia, unspecified: Secondary | ICD-10-CM

## 2023-04-04 DIAGNOSIS — E119 Type 2 diabetes mellitus without complications: Secondary | ICD-10-CM | POA: Insufficient documentation

## 2023-04-04 DIAGNOSIS — Z23 Encounter for immunization: Secondary | ICD-10-CM | POA: Diagnosis not present

## 2023-04-04 DIAGNOSIS — G4733 Obstructive sleep apnea (adult) (pediatric): Secondary | ICD-10-CM

## 2023-04-04 DIAGNOSIS — I10 Essential (primary) hypertension: Secondary | ICD-10-CM

## 2023-04-04 LAB — COMPREHENSIVE METABOLIC PANEL
ALT: 16 U/L (ref 0–35)
AST: 17 U/L (ref 0–37)
Albumin: 4 g/dL (ref 3.5–5.2)
Alkaline Phosphatase: 114 U/L (ref 39–117)
BUN: 12 mg/dL (ref 6–23)
CO2: 32 meq/L (ref 19–32)
Calcium: 9.3 mg/dL (ref 8.4–10.5)
Chloride: 99 meq/L (ref 96–112)
Creatinine, Ser: 0.52 mg/dL (ref 0.40–1.20)
GFR: 96.7 mL/min (ref >60.00)
Glucose, Bld: 112 mg/dL — ABNORMAL HIGH (ref 70–99)
Potassium: 3.5 meq/L (ref 3.5–5.1)
Sodium: 140 meq/L (ref 135–145)
Total Bilirubin: 0.5 mg/dL (ref 0.2–1.2)
Total Protein: 6.9 g/dL (ref 6.0–8.3)

## 2023-04-04 LAB — CBC WITH DIFFERENTIAL/PLATELET
Basophils Absolute: 0 10*3/uL (ref 0.0–0.1)
Basophils Relative: 0.7 % (ref 0.0–3.0)
Eosinophils Absolute: 0.2 10*3/uL (ref 0.0–0.7)
Eosinophils Relative: 2.7 % (ref 0.0–5.0)
HCT: 35.6 % — ABNORMAL LOW (ref 36.0–46.0)
Hemoglobin: 11.4 g/dL — ABNORMAL LOW (ref 12.0–15.0)
Lymphocytes Relative: 25.4 % (ref 12.0–46.0)
Lymphs Abs: 1.7 10*3/uL (ref 0.7–4.0)
MCHC: 31.9 g/dL (ref 30.0–36.0)
MCV: 92.1 fL (ref 78.0–100.0)
Monocytes Absolute: 0.8 10*3/uL (ref 0.1–1.0)
Monocytes Relative: 12.2 % — ABNORMAL HIGH (ref 3.0–12.0)
Neutro Abs: 3.9 10*3/uL (ref 1.4–7.7)
Neutrophils Relative %: 59 % (ref 43.0–77.0)
Platelets: 299 10*3/uL (ref 150.0–400.0)
RBC: 3.86 Mil/uL — ABNORMAL LOW (ref 3.87–5.11)
RDW: 13.9 % (ref 11.5–15.5)
WBC: 6.6 10*3/uL (ref 4.0–10.5)

## 2023-04-04 LAB — LIPID PANEL
Cholesterol: 109 mg/dL (ref 0–200)
HDL: 45.1 mg/dL (ref >39.00)
LDL Cholesterol: 49 mg/dL (ref 0–99)
NonHDL: 63.54
Total CHOL/HDL Ratio: 2
Triglycerides: 74 mg/dL (ref 0.0–149.0)
VLDL: 14.8 mg/dL (ref 0.0–40.0)

## 2023-04-04 LAB — HEMOGLOBIN A1C: Hgb A1c MFr Bld: 7.1 % — ABNORMAL HIGH (ref 4.6–6.5)

## 2023-04-04 MED ORDER — METFORMIN HCL 500 MG PO TABS
500.0000 mg | ORAL_TABLET | Freq: Two times a day (BID) | ORAL | 3 refills | Status: DC
Start: 2023-04-04 — End: 2023-10-30

## 2023-04-04 MED ORDER — OZEMPIC (0.25 OR 0.5 MG/DOSE) 2 MG/1.5ML ~~LOC~~ SOPN: 0.2500 mg | PEN_INJECTOR | SUBCUTANEOUS | 5 refills | Status: DC

## 2023-04-04 NOTE — Assessment & Plan Note (Signed)
Chronic stable condition Diet and nutrition discussed Benefits of exercise discussed Lipid profile done today Continue atorvastatin 80 mg daily

## 2023-04-04 NOTE — Assessment & Plan Note (Signed)
BP Readings from Last 3 Encounters:  04/04/23 120/86  03/07/23 120/70  02/07/23 (!) 140/70  Well-controlled hypertension Continue atenolol 50 mg daily and hydrochlorothiazide 12.5 mg daily Cardiovascular risks associated with hypertension discussed Dietary approaches to stop hypertension discussed Blood work done today Follow-up in 6 months

## 2023-04-04 NOTE — Patient Instructions (Signed)
Health Maintenance After Age 65 After age 65, you are at a higher risk for certain long-term diseases and infections as well as injuries from falls. Falls are a major cause of broken bones and head injuries in people who are older than age 65. Getting regular preventive care can help to keep you healthy and well. Preventive care includes getting regular testing and making lifestyle changes as recommended by your health care provider. Talk with your health care provider about: Which screenings and tests you should have. A screening is a test that checks for a disease when you have no symptoms. A diet and exercise plan that is right for you. What should I know about screenings and tests to prevent falls? Screening and testing are the best ways to find a health problem early. Early diagnosis and treatment give you the best chance of managing medical conditions that are common after age 65. Certain conditions and lifestyle choices may make you more likely to have a fall. Your health care provider may recommend: Regular vision checks. Poor vision and conditions such as cataracts can make you more likely to have a fall. If you wear glasses, make sure to get your prescription updated if your vision changes. Medicine review. Work with your health care provider to regularly review all of the medicines you are taking, including over-the-counter medicines. Ask your health care provider about any side effects that may make you more likely to have a fall. Tell your health care provider if any medicines that you take make you feel dizzy or sleepy. Strength and balance checks. Your health care provider may recommend certain tests to check your strength and balance while standing, walking, or changing positions. Foot health exam. Foot pain and numbness, as well as not wearing proper footwear, can make you more likely to have a fall. Screenings, including: Osteoporosis screening. Osteoporosis is a condition that causes  the bones to get weaker and break more easily. Blood pressure screening. Blood pressure changes and medicines to control blood pressure can make you feel dizzy. Depression screening. You may be more likely to have a fall if you have a fear of falling, feel depressed, or feel unable to do activities that you used to do. Alcohol use screening. Using too much alcohol can affect your balance and may make you more likely to have a fall. Follow these instructions at home: Lifestyle Do not drink alcohol if: Your health care provider tells you not to drink. If you drink alcohol: Limit how much you have to: 0-1 drink a day for women. 0-2 drinks a day for men. Know how much alcohol is in your drink. In the [place] one drink equals one 12 oz bottle of beer (355 mL), one 5 oz glass of wine (148 mL), or one 1 oz glass of hard liquor (44 mL). Do not use any products that contain nicotine or tobacco. These products include cigarettes, chewing tobacco, and vaping devices, such as e-cigarettes. If you need help quitting, ask your health care provider. Activity  Follow a regular exercise program to stay fit. This will help you maintain your balance. Ask your health care provider what types of exercise are appropriate for you. If you need a cane or walker, use it as recommended by your health care provider. Wear supportive shoes that have nonskid soles. Safety  Remove any tripping hazards, such as rugs, cords, and clutter. Install safety equipment such as grab bars in bathrooms and safety rails on stairs. Keep rooms and walkways   well-lit. General instructions Talk with your health care provider about your risks for falling. Tell your health care provider if: You fall. Be sure to tell your health care provider about all falls, even ones that seem minor. You feel dizzy, tiredness (fatigue), or off-balance. Take over-the-counter and prescription medicines only as told by your health care provider. These include  supplements. Eat a healthy diet and maintain a healthy weight. A healthy diet includes low-fat dairy products, low-fat (lean) meats, and fiber from whole grains, beans, and lots of fruits and vegetables. Stay current with your vaccines. Schedule regular health, dental, and eye exams. Summary Having a healthy lifestyle and getting preventive care can help to protect your health and wellness after age 65. Screening and testing are the best way to find a health problem early and help you avoid having a fall. Early diagnosis and treatment give you the best chance for managing medical conditions that are more common for people who are older than age 65. Falls are a major cause of broken bones and head injuries in people who are older than age 65. Take precautions to prevent a fall at home. Work with your health care provider to learn what changes you can make to improve your health and wellness and to prevent falls. This information is not intended to replace advice given to you by your health care provider. Make sure you discuss any questions you have with your health care provider. Document Revised: 10/31/2020 Document Reviewed: 10/31/2020 Elsevier Patient Education  2024 Elsevier Inc.  

## 2023-04-04 NOTE — Assessment & Plan Note (Signed)
Diet and nutrition discussed Lipid profile done today Continue atorvastatin 80 mg daily Also taking daily baby aspirin
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
## 2023-04-04 NOTE — Telephone Encounter (Signed)
Pharmacist needs this medication resent to the pharmacy due to the pt insurance wanting the correct QUANTITY.   Semaglutide,0.25 or 0.5MG /DOS, (OZEMPIC, 0.25 OR 0.5 MG/DOSE,) 2 MG/1.5ML SOPN 1.5 mL 5 04/04/2023 --  Inject 0.25 mg into the skin once a week. Increase dose to 0.5 mg weekly after 2 weeks if side effects tolerated - Subcutaneous   Instead of the Quantity 1.5 ML , they need Box package.Marland Kitchen

## 2023-04-04 NOTE — Assessment & Plan Note (Signed)
Stable and well-controlled on CPAP treatment.

## 2023-04-04 NOTE — Assessment & Plan Note (Signed)
Chronic and well-controlled Follows up with pain management clinic on a regular basis. Chronic pain medications handled by their office.

## 2023-04-05 ENCOUNTER — Other Ambulatory Visit: Payer: Self-pay | Admitting: *Deleted

## 2023-04-05 DIAGNOSIS — E119 Type 2 diabetes mellitus without complications: Secondary | ICD-10-CM

## 2023-04-05 MED ORDER — OZEMPIC (0.25 OR 0.5 MG/DOSE) 2 MG/1.5ML ~~LOC~~ SOPN: 0.2500 mg | PEN_INJECTOR | SUBCUTANEOUS | 5 refills | Status: DC

## 2023-04-05 NOTE — Telephone Encounter (Signed)
Medication re sent to patient pharmacy with correct quantity.

## 2023-04-08 ENCOUNTER — Encounter: Payer: Self-pay | Admitting: Gastroenterology
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
## 2023-04-15 ENCOUNTER — Encounter: Payer: Self-pay | Admitting: Cardiology

## 2023-04-15 ENCOUNTER — Ambulatory Visit: Payer: Medicare PPO | Attending: Cardiology | Admitting: Cardiology

## 2023-04-15 VITALS — Ht 71.0 in

## 2023-04-15 DIAGNOSIS — I10 Essential (primary) hypertension: Secondary | ICD-10-CM | POA: Diagnosis not present

## 2023-04-15 DIAGNOSIS — G4733 Obstructive sleep apnea (adult) (pediatric): Secondary | ICD-10-CM

## 2023-04-15 NOTE — Addendum Note (Signed)
Addended by: Luellen Pucker on: 04/15/2023 08:59 AM   Modules accepted: Orders

## 2023-04-15 NOTE — Progress Notes (Signed)
Do you snore loudly?  Louder than talking or loud enough to be heard through closed doors  No0  Yes+1 Do you often feel tired, fatigued, or sleepy during the daytime?   No0  Yes+1 Has anyone observed you stop breathing during sleep?   No0  Yes+1 Do you have (or are you being treated for) high blood pressure?   No0  Yes+1 Objective measures: BMI   <=35 kg/m 0  >35 kg/m +1 Age   <=50 years 0  >50 years +1 Neck circumference   <=40 cm0  >40 cm+1 Gender   Female0  Female+1 4 points STOP-BANG Intermediate

## 2023-04-15 NOTE — Patient Instructions (Addendum)
Medication Instructions:  Your physician recommends that you continue on your current medications as directed. Please refer to the Current Medication list given to you today.  *If you need a refill on your cardiac medications before your next appointment, please call your pharmacy*   Lab Work: None.  If you have labs (blood work) drawn today and your tests are completely normal, you will receive your results only by: MyChart Message (if you have MyChart) OR A paper copy in the mail If you have any lab test that is abnormal or we need to change your treatment, we will call you to review the results.   Testing/Procedures: Your physician has recommended that you have a home sleep study. This test records several body functions during sleep, including: brain activity, eye movement, oxygen and carbon dioxide blood levels, heart rate and rhythm, breathing rate and rhythm, the flow of air through your mouth and nose, snoring, body muscle movements, and chest and belly movement.    Follow-Up:  Your next appointment:   3 month(s)  Provider:   Dr. Armanda Magic, MD   Other Instructions You have been referred to Dr. Launa Flight, ENT for [facility] evaluation. Someone from her office will call to get you scheduled.

## 2023-04-17 ENCOUNTER — Encounter (INDEPENDENT_AMBULATORY_CARE_PROVIDER_SITE_OTHER): Payer: Self-pay | Admitting: Otolaryngology

## 2023-04-20 ENCOUNTER — Encounter: Payer: Self-pay | Admitting: Certified Registered Nurse Anesthetist

## 2023-04-22 ENCOUNTER — Ambulatory Visit (AMBULATORY_SURGERY_CENTER): Payer: Medicare PPO | Admitting: Gastroenterology

## 2023-04-22 ENCOUNTER — Encounter: Payer: Self-pay | Admitting: Gastroenterology

## 2023-04-22 VITALS — BP 101/59 | HR 66 | Temp 96.4°F | Resp 14 | Ht 71.0 in | Wt 272.0 lb

## 2023-04-22 DIAGNOSIS — K641 Second degree hemorrhoids: Secondary | ICD-10-CM

## 2023-04-22 DIAGNOSIS — R195 Other fecal abnormalities: Secondary | ICD-10-CM

## 2023-04-22 DIAGNOSIS — Z1211 Encounter for screening for malignant neoplasm of colon: Secondary | ICD-10-CM | POA: Diagnosis not present

## 2023-04-22 DIAGNOSIS — E119 Type 2 diabetes mellitus without complications: Secondary | ICD-10-CM | POA: Diagnosis not present

## 2023-04-22 DIAGNOSIS — I10 Essential (primary) hypertension: Secondary | ICD-10-CM | POA: Diagnosis not present

## 2023-04-22 DIAGNOSIS — G4730 Sleep apnea, unspecified: Secondary | ICD-10-CM | POA: Diagnosis not present

## 2023-04-22 DIAGNOSIS — F32A Depression, unspecified: Secondary | ICD-10-CM | POA: Diagnosis not present

## 2023-04-22 MED ORDER — SODIUM CHLORIDE 0.9 % IV SOLN: 500.0000 mL | Freq: Once | INTRAVENOUS | Status: DC

## 2023-04-22 NOTE — Patient Instructions (Signed)
Resume previous diet Continue present medications Await pathology results  Handouts/information given for hemorrhoids   YOU HAD AN ENDOSCOPIC PROCEDURE TODAY AT THE [facility]:   Refer to the procedure report that was given to you for any specific questions about what was found during the examination.  If the procedure report does not answer your questions, please call your gastroenterologist to clarify.  If you requested that your care partner not be given the details of your procedure findings, then the procedure report has been included in a sealed envelope for you to review at your convenience later.  YOU SHOULD EXPECT: Some feelings of bloating in the abdomen. Passage of more gas than usual.  Walking can help get rid of the air that was put into your GI tract during the procedure and reduce the bloating. If you had a lower endoscopy (such as a colonoscopy or flexible sigmoidoscopy) you may notice spotting of blood in your stool or on the toilet paper. If you underwent a bowel prep for your procedure, you may not have a normal bowel movement for a few days.  Please Note:  You might notice some irritation and congestion in your nose or some drainage.  This is from the oxygen used during your procedure.  There is no need for concern and it should clear up in a day or so.  SYMPTOMS TO REPORT IMMEDIATELY:  Following lower endoscopy (colonoscopy or flexible sigmoidoscopy):  Excessive amounts of blood in the stool  Significant tenderness or worsening of abdominal pains  Swelling of the abdomen that is new, acute  Fever of 100F or higher  For urgent or emergent issues, a gastroenterologist can be reached at any hour by calling [phone]. Do not use MyChart messaging for urgent concerns.    DIET:  We do recommend a small meal at first, but then you may proceed to your regular diet.  Drink plenty of fluids but you should avoid alcoholic beverages for 24 hours.  ACTIVITY:   You should plan to take it easy for the rest of today and you should NOT DRIVE or use heavy machinery until tomorrow (because of the sedation medicines used during the test).    FOLLOW UP: Our staff will call the number listed on your records the next business day following your procedure.  We will call around 7:15- 8:00 am to check on you and address any questions or concerns that you may have regarding the information given to you following your procedure. If we do not reach you, we will leave a message.     If any biopsies were taken you will be contacted by phone or by letter within the next 1-3 weeks.  Please call us at [phone] if you have not heard about the biopsies in 3 weeks.    SIGNATURES/CONFIDENTIALITY: You and/or your care partner have signed paperwork which will be entered into your electronic medical record.  These signatures attest to the fact that that the information above on your After Visit Summary has been reviewed and is understood.  Full responsibility of the confidentiality of this discharge information lies with you and/or your care-partner.

## 2023-04-22 NOTE — Progress Notes (Signed)
Report given to PACU, vss 

## 2023-04-22 NOTE — Op Note (Signed)
[facility] Patient Name: Patricia Davidson Procedure Date: 04/22/2023 1:20 PM MRN: [id number] Endoscopist: Doristine Locks , MD, [id number] Age: 67 Referring MD:  Date of Birth: 06-Jun-1956 Gender: Female Account #: [id number] Procedure:                Colonoscopy Indications:              Positive Cologuard test                           - 03/2005: Colonoscopy: Normal                           - 07/06/2015: Colonoscopy (Dr. Mayford Knife at [facility]):                            Inadequate prep                           - 08/17/2015: Colonoscopy (Dr. Mayford Knife at [facility]):                            Normal, internal hemorrhoids. Repeat in 10 years                           - 04/13/2022: Cologuard positive Medicines:                Monitored Anesthesia Care Procedure:                Pre-Anesthesia Assessment:                           - Prior to the procedure, a History and Physical                            was performed, and patient medications and                            allergies were reviewed. The patient's tolerance of                            previous anesthesia was also reviewed. The risks                            and benefits of the procedure and the sedation                            options and risks were discussed with the patient.                            All questions were answered, and informed consent                            was obtained. Prior Anticoagulants: The patient has                            taken no anticoagulant or antiplatelet  agents. ASA                            Grade Assessment: II - A patient with mild systemic                            disease. After reviewing the risks and benefits,                            the patient was deemed in satisfactory condition to                            undergo the procedure.                           After obtaining informed consent, the colonoscope                            was passed under direct  vision. Throughout the                            procedure, the patient's blood pressure, pulse, and                            oxygen saturations were monitored continuously. The                            CF [id number] #[id number] was introduced through the anus                            and advanced to the the cecum, identified by                            appendiceal orifice and ileocecal valve. The                            colonoscopy was technically difficult and complex                            due to significant looping. Successful completion                            of the procedure was aided by applying abdominal                            pressure. The patient tolerated the procedure well.                            The quality of the bowel preparation was good. The                            ileocecal valve, appendiceal orifice, and rectum  were photographed. Scope In: 1:40:57 PM Scope Out: 2:05:53 PM Scope Withdrawal Time: 0 hours 17 minutes 26 seconds  Total Procedure Duration: 0 hours 24 minutes 56 seconds  Findings:                 Hemorrhoids were found on perianal exam.                           The entire colon appeared normal.                           Non-bleeding internal hemorrhoids were found during                            retroflexion. The hemorrhoids were small and Grade                            II (internal hemorrhoids that prolapse but reduce                            spontaneously).                           The ascending colon revealed moderately excessive                            looping. Advancing the scope required using manual                            pressure. Complications:            No immediate complications. Estimated Blood Loss:     Estimated blood loss: none. Impression:               - Hemorrhoids found on perianal exam.                           - The entire examined colon is normal.                            - Non-bleeding internal hemorrhoids.                           - There was significant looping of the colon.                           - No specimens collected. Recommendation:           - Patient has a contact number available for                            emergencies. The signs and symptoms of potential                            delayed complications were discussed with the                            patient. Return to normal activities tomorrow.  Written discharge instructions were provided to the                            patient.                           - Resume previous diet.                           - Continue present medications.                           - Repeat colonoscopy in 10 years for screening                            purposes.                           - Return to GI office PRN. Doristine Locks, MD 04/22/2023 2:13:28 PM
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
## 2023-04-23 ENCOUNTER — Telehealth: Payer: Self-pay

## 2023-04-23 DIAGNOSIS — G4733 Obstructive sleep apnea (adult) (pediatric): Secondary | ICD-10-CM | POA: Diagnosis not present

## 2023-04-23 NOTE — Telephone Encounter (Signed)
  Follow up Call-     04/22/2023   12:47 PM  Call back number  Post procedure Call Back phone  # [phone]  Permission to leave phone message Yes     Patient questions:  Do you have a fever, pain , or abdominal swelling? No. Pain Score  0 *  Have you tolerated food without any problems? Yes.    Have you been able to return to your normal activities? Yes.    Do you have any questions about your discharge instructions: Diet   No. Medications  No. Follow up visit  No.  Do you have questions or concerns about your Care? No.  Actions: * If pain score is 4 or above: No action needed, pain <4.

## 2023-05-03 ENCOUNTER — Telehealth: Payer: Self-pay

## 2023-05-03 NOTE — Telephone Encounter (Signed)
**Note De-Identified Patricia Davidson Obfuscation** Ordering provider: Dr Mayford Knife Associated diagnoses: OSA-G47.33 WatchPAT PA obtained on 05/03/2023 by Ganesh Deeg, Lorelle Formosa, LPN. Authorization: The following codes do not require a submission: 95800 Sleep study, unattended, simultaneous recording; heart rate, oxygen saturation, respiratory analysis (eg, by airflow or peripheral arterial tone), and sleep time. Per [facility] provider portal. Created on 05/03/2023 Patient NOT notified of PIN ([id number]) on 05/03/2023 as she has not been provided a WatchPAT One-HST Device.  Phone note routed to covering staff for follow-up.

## 2023-05-03 NOTE — Telephone Encounter (Signed)
I received a message today about an Itamar study for the pt. I was not made aware the pt was needing an Itamar study. I will reach out to the pt to see when she can come by the office to set up sleep study.    I left the pt a message to call back to set up a time to come by the office.

## 2023-05-06 NOTE — Telephone Encounter (Signed)
-----   Message from [facility] sent at 05/03/2023 11:32 AM EST ----- Regarding: NEEDS ITAMAR TO BE SET UP; PT APPROVED You routed conversation to Via, Lorelle Formosa, LPN; Luellen Pucker, RNJust now (11:31 AM)  Valora Corporal now (11:31 AM)   I received a message today about an Itamar study for the pt. I was not made aware the pt was needing an Itamar study. I will reach out to the pt to see when she can come by the office to set up sleep study.      I left the pt a message to call back to set up a time to come by the office.    Note  You  Conny, Javed "Beth" [phone] minute ago (11:30 AM)  Via, Lorelle Formosa, LPN  You; Luellen Pucker, RN14 minutes ago (11:16 AM)   The pt had a Video Visit with Dr Mayford Knife on 10/21 and a Itamar-HST was ordered but I cannot find documentation in the pts chart indicating that she was provided a WatchPAT One-HST device. If she has been, my apologies and please forward note back to me and I will contact her with her Pin #. FYI: Instructions for covering staff: 1. Please contact patient in 2 weeks if WatchPAT study results are not available yet. Remind patient to complete test. 2. If patient declines to proceed with test, please confirm that box is unopened and remind patient to return it to the office within 30 days. Route phone note to [facility] SLEEP STUDIES pool for tracking. 3. If box has been opened, please route phone note to Erskine Squibb (billing department).  Via, Lorelle Formosa, LPN15 minutes ago (11:16 AM)   1. Ordering provider: Dr Mayford Knife 2. Associated diagnoses: OSA-G47.33 3. WatchPAT PA obtained on 05/03/2023 by Via, Lorelle Formosa, LPN. Authorization: The following codes do not require a submission: 95800 Sleep study, unattended, simultaneous recording; heart rate, oxygen saturation, respiratory analysis (eg, by airflow or peripheral arterial tone), and sleep time. Per [facility] provider portal. Created on 05/03/2023 4. Patient NOT notified of PIN  (1234) on 05/03/2023 as she has not been provided a WatchPAT One-HST Device.   Phone note routed to covering staff for follow-up.

## 2023-05-06 NOTE — Telephone Encounter (Signed)
Left message to call back to set up Itamar sleep study. I will need the pt to come by the office to set up sleep study.

## 2023-05-09 NOTE — Telephone Encounter (Signed)
I have called and left message x 3 for the pt to come by the office to set up Itamar sleep study. I will update Dr. Norris Cross nurse Alcario Drought as well.

## 2023-05-09 NOTE — Telephone Encounter (Signed)
-----   Message from [facility] sent at 05/03/2023 11:32 AM EST ----- Regarding: NEEDS ITAMAR TO BE SET UP; PT APPROVED You routed conversation to Via, Lorelle Formosa, LPN; Luellen Pucker, RNJust now (11:31 AM)  Valora Corporal now (11:31 AM)   I received a message today about an Itamar study for the pt. I was not made aware the pt was needing an Itamar study. I will reach out to the pt to see when she can come by the office to set up sleep study.      I left the pt a message to call back to set up a time to come by the office.    Note  You  Conny, Javed "Beth" [phone] minute ago (11:30 AM)  Via, Lorelle Formosa, LPN  You; Luellen Pucker, RN14 minutes ago (11:16 AM)   The pt had a Video Visit with Dr Mayford Knife on 10/21 and a Itamar-HST was ordered but I cannot find documentation in the pts chart indicating that she was provided a WatchPAT One-HST device. If she has been, my apologies and please forward note back to me and I will contact her with her Pin #. FYI: Instructions for covering staff: 1. Please contact patient in 2 weeks if WatchPAT study results are not available yet. Remind patient to complete test. 2. If patient declines to proceed with test, please confirm that box is unopened and remind patient to return it to the office within 30 days. Route phone note to [facility] SLEEP STUDIES pool for tracking. 3. If box has been opened, please route phone note to Erskine Squibb (billing department).  Via, Lorelle Formosa, LPN15 minutes ago (11:16 AM)   1. Ordering provider: Dr Mayford Knife 2. Associated diagnoses: OSA-G47.33 3. WatchPAT PA obtained on 05/03/2023 by Via, Lorelle Formosa, LPN. Authorization: The following codes do not require a submission: 95800 Sleep study, unattended, simultaneous recording; heart rate, oxygen saturation, respiratory analysis (eg, by airflow or peripheral arterial tone), and sleep time. Per [facility] provider portal. Created on 05/03/2023 4. Patient NOT notified of PIN  (1234) on 05/03/2023 as she has not been provided a WatchPAT One-HST Device.   Phone note routed to covering staff for follow-up.

## 2023-05-10 NOTE — Telephone Encounter (Signed)
Call to patient who states she has been out of town but is returning this weekend. Patient states she can come pick up device on Wednesday November 20,2024 between 10 and 12.

## 2023-05-15 ENCOUNTER — Telehealth: Payer: Self-pay | Admitting: *Deleted

## 2023-05-15 NOTE — Telephone Encounter (Signed)
Pt came by the office today has set up Itamar study.   DR. Derl Barrow.   Patient agreement reviewed and signed on 05/15/2023.  WatchPAT issued to patient on 05/15/2023 by Danielle Rankin, CMA. Patient aware to not open the WatchPAT box until contacted with the activation PIN. Patient profile initialized in CloudPAT on 05/15/2023 by Danielle Rankin, CMA. Device serial number: [id number]  Please list Reason for Call as Advice Only and type "WatchPAT issued to patient" in the comment box.   PT HAS BEEN APPROVED AND HAS BEEN GIVEN PIN# [id number].

## 2023-05-16 ENCOUNTER — Encounter (INDEPENDENT_AMBULATORY_CARE_PROVIDER_SITE_OTHER): Payer: Medicare PPO | Admitting: Cardiology

## 2023-05-16 DIAGNOSIS — G4733 Obstructive sleep apnea (adult) (pediatric): Secondary | ICD-10-CM

## 2023-05-17 ENCOUNTER — Telehealth: Payer: Self-pay | Admitting: *Deleted

## 2023-05-17 ENCOUNTER — Ambulatory Visit: Payer: Medicare PPO | Attending: Cardiology

## 2023-05-17 DIAGNOSIS — G4733 Obstructive sleep apnea (adult) (pediatric): Secondary | ICD-10-CM

## 2023-05-17 NOTE — Telephone Encounter (Signed)
-----   Message from Armanda Magic sent at 05/17/2023  3:59 PM EST ----- Patient has severe OSA and therefore qualifies for Inspire. Please refer to Dr. Jenne Pane

## 2023-05-17 NOTE — Telephone Encounter (Signed)
The patient has been notified of the result and verbalized understanding.  All questions (if any) were answered. Latrelle Dodrill, CMA 05/17/2023 5:37 PM   Patient has already heard from the ENT has an appt at 9:30 on 06/07/23.  Pt is agreeable to results.

## 2023-05-17 NOTE — Procedures (Signed)
SLEEP STUDY REPORT Patient Information Study Date: 05/17/2023 Patient Name: Patricia Davidson Patient ID: [id number] Birth Date: 1956-04-18 Age: 67 Gender: Female BMI: 38.5 (W=269 lb, H=5' 10'') Stopbang: 4 Referring Physician: Armanda Magic, MD  TEST DESCRIPTION: Home sleep apnea testing was completed using the WatchPat, a Type 1 device, utilizing  peripheral arterial tonometry (PAT), chest movement, actigraphy, pulse oximetry, pulse rate, body position and snore.  AHI was calculated with apnea and hypopnea using valid sleep time as the denominator. RDI includes apneas,  hypopneas, and RERAs. The data acquired and the scoring of sleep and all associated events were performed in  accordance with the recommended standards and specifications as outlined in the AASM Manual for the Scoring of  Sleep and Associated Events 2.2.0 (2015).  FINDINGS:  1. Severe Obstructive Sleep Apnea with AHI 40.3/hr.   2. Minimal Central Sleep Apnea with pAHIc 12/hr.  3. Oxygen desaturations as low as 76%.  4. Moderate snoring was present. O2 sats were < 88% for 10.7 min.  5. Total sleep time was 3 hrs and 50 min.  6. 17.4% of total sleep time was spent in REM sleep.   7. Shortened sleep onset latency at 6 min.   8. Prolonged REM sleep onset latency at 229 min.   9. Total awakenings were 22.  10. Arrhythmia detection: Suggestive of possible brief atrial fibrillation lasting 37 seconds. This is not diagnostic and  further testing with outpatient telemetry monitoring is recommended.  DIAGNOSIS:  Severe Obstructive Sleep Apnea (G47.33) Nocturnal Hypoxemia Possible Atrial Fibrillation  RECOMMENDATIONS: 1. Clinical correlation of these findings is necessary. The decision to treat obstructive sleep apnea (OSA) is usually  based on the presence of apnea symptoms or the presence of associated medical conditions such as Hypertension,  Congestive Heart Failure, Atrial Fibrillation or Obesity. The most common  symptoms of OSA are snoring, gasping for  breath while sleeping, daytime sleepiness and fatigue.   2. Initiating apnea therapy is recommended given the presence of symptoms and/or associated conditions.  Recommend proceeding with one of the following:   a. Auto-CPAP therapy with a pressure range of 5-20cm H2O.   b. An oral appliance (OA) that can be obtained from certain dentists with expertise in sleep medicine. These are  primarily of use in non-obese patients with mild and moderate disease.   c. An ENT consultation which may be useful to look for specific causes of obstruction and possible treatment  options.   d. If patient is intolerant to PAP therapy, consider referral to ENT for evaluation for hypoglossal nerve stimulator.   3. Close follow-up is necessary to ensure success with CPAP or oral appliance therapy for maximum benefit .  4. A follow-up oximetry study on CPAP is recommended to assess the adequacy of therapy and determine the need  for supplemental oxygen or the potential need for Bi-level therapy. An arterial blood gas to determine the adequacy of  baseline ventilation and oxygenation should also be considered.  5. Healthy sleep recommendations include: adequate nightly sleep (normal 7-9 hrs/night), avoidance of caffeine after  noon and alcohol near bedtime, and maintaining a sleep environment that is cool, dark and quiet.  6. Weight loss for overweight patients is recommended. Even modest amounts of weight loss can significantly  improve the severity of sleep apnea.  7. Snoring recommendations include: weight loss where appropriate, side sleeping, and avoidance of alcohol before  bed.  8. Operation of motor vehicle should be avoided when sleepy.  Signature: Armanda Magic,  MD; Ruthann Cancer; Diplomat, American Board of Sleep  Medicine Electronically Signed: 05/17/2023 3:58:13 PM

## 2023-05-21 ENCOUNTER — Telehealth: Payer: Self-pay | Admitting: *Deleted

## 2023-05-21 ENCOUNTER — Other Ambulatory Visit: Payer: Self-pay | Admitting: Internal Medicine

## 2023-05-21 ENCOUNTER — Ambulatory Visit: Payer: Medicare PPO | Attending: Internal Medicine

## 2023-05-21 ENCOUNTER — Telehealth: Payer: Self-pay | Admitting: Otolaryngology

## 2023-05-21 DIAGNOSIS — I4891 Unspecified atrial fibrillation: Secondary | ICD-10-CM

## 2023-05-21 DIAGNOSIS — I480 Paroxysmal atrial fibrillation: Secondary | ICD-10-CM | POA: Diagnosis not present

## 2023-05-21 NOTE — Telephone Encounter (Signed)
Pt states she would like to come in today to get her hear monitor put on. Please advise

## 2023-05-21 NOTE — Telephone Encounter (Signed)
Evaluation for Inspire Device// called and left vmail 05-21-23, to r/s apt, provider out of office.also adding a telemessage to pt chart

## 2023-05-21 NOTE — Progress Notes (Unsigned)
PHILIP event monitor serial # [id number] from office inventory applied to patient.

## 2023-05-21 NOTE — Telephone Encounter (Signed)
Patient states that she has had afib on and off for years.  She said it used to be just about a min or so and now it seems to last about 12 hours or so.

## 2023-05-21 NOTE — Telephone Encounter (Signed)
-----   Message from Orbie Pyo sent at 05/17/2023  7:54 PM EST ----- Thanks.  Shlomo Seres, please set up with 30 d monitor re AF ----- Message ----- From: Quintella Reichert, MD Sent: 05/17/2023   4:01 PM EST To: Orbie Pyo, MD  Sending for workup for Inspire - ? Very short burst of PAF

## 2023-05-21 NOTE — Telephone Encounter (Signed)
Scheduled to come in 05/21/23, 3:30PM.

## 2023-05-21 NOTE — Telephone Encounter (Signed)
Order placed for event monitor.  I spoke w the patient.  She is aware we will call her to come in for monitor to be applied.

## 2023-06-07 ENCOUNTER — Ambulatory Visit (INDEPENDENT_AMBULATORY_CARE_PROVIDER_SITE_OTHER): Payer: Medicare PPO | Admitting: Otolaryngology

## 2023-06-07 ENCOUNTER — Institutional Professional Consult (permissible substitution) (INDEPENDENT_AMBULATORY_CARE_PROVIDER_SITE_OTHER): Payer: Medicare PPO | Admitting: Otolaryngology

## 2023-06-07 ENCOUNTER — Encounter (INDEPENDENT_AMBULATORY_CARE_PROVIDER_SITE_OTHER): Payer: Self-pay | Admitting: Otolaryngology

## 2023-06-07 VITALS — BP 120/73 | HR 89 | Ht 71.0 in | Wt 244.0 lb

## 2023-06-07 DIAGNOSIS — Z789 Other specified health status: Secondary | ICD-10-CM

## 2023-06-07 DIAGNOSIS — J343 Hypertrophy of nasal turbinates: Secondary | ICD-10-CM

## 2023-06-07 DIAGNOSIS — R0981 Nasal congestion: Secondary | ICD-10-CM | POA: Diagnosis not present

## 2023-06-07 DIAGNOSIS — G4733 Obstructive sleep apnea (adult) (pediatric): Secondary | ICD-10-CM

## 2023-06-07 DIAGNOSIS — J342 Deviated nasal septum: Secondary | ICD-10-CM | POA: Diagnosis not present

## 2023-06-07 DIAGNOSIS — Z6834 Body mass index (BMI) 34.0-34.9, adult: Secondary | ICD-10-CM

## 2023-06-07 DIAGNOSIS — K219 Gastro-esophageal reflux disease without esophagitis: Secondary | ICD-10-CM | POA: Diagnosis not present

## 2023-06-07 DIAGNOSIS — E669 Obesity, unspecified: Secondary | ICD-10-CM | POA: Diagnosis not present

## 2023-06-07 NOTE — Patient Instructions (Signed)
  It was very nice to meet you today,   Please see the following link for the [facility] program   [email or web address]  This website has information about how you can connect to other patients who have had Inspire Implant procedure
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
## 2023-06-24 ENCOUNTER — Institutional Professional Consult (permissible substitution) (INDEPENDENT_AMBULATORY_CARE_PROVIDER_SITE_OTHER): Payer: Medicare PPO | Admitting: Otolaryngology

## 2023-06-25 DIAGNOSIS — G4733 Obstructive sleep apnea (adult) (pediatric): Secondary | ICD-10-CM | POA: Diagnosis not present

## 2023-06-25 DIAGNOSIS — Z6833 Body mass index (BMI) 33.0-33.9, adult: Secondary | ICD-10-CM | POA: Diagnosis not present

## 2023-07-01 ENCOUNTER — Other Ambulatory Visit (INDEPENDENT_AMBULATORY_CARE_PROVIDER_SITE_OTHER): Payer: Self-pay | Admitting: Otolaryngology

## 2023-07-01 ENCOUNTER — Telehealth (INDEPENDENT_AMBULATORY_CARE_PROVIDER_SITE_OTHER): Payer: Self-pay | Admitting: Otolaryngology

## 2023-07-01 DIAGNOSIS — Z789 Other specified health status: Secondary | ICD-10-CM

## 2023-07-01 DIAGNOSIS — G4733 Obstructive sleep apnea (adult) (pediatric): Secondary | ICD-10-CM

## 2023-07-01 NOTE — Telephone Encounter (Signed)
 A user error has taken place: encounter opened in error, closed for administrative reasons.

## 2023-07-25 ENCOUNTER — Telehealth: Payer: Self-pay

## 2023-07-25 NOTE — Telephone Encounter (Signed)
Call to patient to advise she does not need Virtual visit on 07/29/23 as her Inspire implantation is not scheduled until 08/20/23. Advised she will need to follow up with [place] afterwards and likely her ENT will make that appt after implantation occurs. Asked patient to call our office if any questions.

## 2023-07-29 ENCOUNTER — Ambulatory Visit: Payer: Medicare PPO | Admitting: Cardiology

## 2023-08-07 ENCOUNTER — Encounter (INDEPENDENT_AMBULATORY_CARE_PROVIDER_SITE_OTHER): Payer: Self-pay | Admitting: Otolaryngology

## 2023-08-07 ENCOUNTER — Ambulatory Visit (INDEPENDENT_AMBULATORY_CARE_PROVIDER_SITE_OTHER): Payer: Medicare PPO | Admitting: Otolaryngology

## 2023-08-07 VITALS — BP 157/82 | HR 84

## 2023-08-07 DIAGNOSIS — Z91198 Patient's noncompliance with other medical treatment and regimen for other reason: Secondary | ICD-10-CM

## 2023-08-07 DIAGNOSIS — J342 Deviated nasal septum: Secondary | ICD-10-CM

## 2023-08-07 DIAGNOSIS — G4733 Obstructive sleep apnea (adult) (pediatric): Secondary | ICD-10-CM | POA: Diagnosis not present

## 2023-08-07 DIAGNOSIS — J343 Hypertrophy of nasal turbinates: Secondary | ICD-10-CM

## 2023-08-07 DIAGNOSIS — R0981 Nasal congestion: Secondary | ICD-10-CM

## 2023-08-07 DIAGNOSIS — Z789 Other specified health status: Secondary | ICD-10-CM
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
## 2023-08-08 ENCOUNTER — Telehealth: Payer: Self-pay | Admitting: *Deleted

## 2023-08-08 NOTE — Telephone Encounter (Signed)
   Pre-operative Risk Assessment    Patient Name: Patricia Davidson  DOB: 1955-11-26 MRN: [id number]   Date of last office visit: 04/15/23 VIDEO VISIT DR. Mayford Knife; 10/29/22 DR. [facility] Date of next office visit: NONE   Request for Surgical Clearance    Procedure:   INSPIRE IMPLANT   Date of Surgery:  Clearance 09/17/23                                Surgeon:  DR. Ashok Croon Surgeon's Group or Practice Name:  [facility]  Phone number:  [phone] Fax number:  [phone]   Type of Clearance Requested:   - Medical  - Pharmacy:  Hold Aspirin     Type of Anesthesia:  General    Additional requests/questions:    Elpidio Anis   08/08/2023, 4:33 PM

## 2023-08-09 ENCOUNTER — Telehealth: Payer: Self-pay | Admitting: *Deleted

## 2023-08-09 NOTE — Telephone Encounter (Signed)
Pt has been scheduled tele preop appt 09/09/23. Pt will be on a cruise the week before and asked to do tele after she is back. Med rec and consent are done.      Patient Consent for Virtual Visit         Patricia Davidson has provided verbal consent on 08/09/2023 for a virtual visit (video or telephone).   CONSENT FOR VIRTUAL VISIT FOR:  Patricia Davidson  By participating in this virtual visit I agree to the following:  I hereby voluntarily request, consent and authorize [facility] and its employed or contracted physicians, physician assistants, nurse practitioners or other licensed health care professionals (the Practitioner), to provide me with telemedicine health care services (the "Services") as deemed necessary by the treating Practitioner. I acknowledge and consent to receive the Services by the Practitioner via telemedicine. I understand that the telemedicine visit will involve communicating with the Practitioner through live audiovisual communication technology and the disclosure of certain medical information by electronic transmission. I acknowledge that I have been given the opportunity to request an in-person assessment or other available alternative prior to the telemedicine visit and am voluntarily participating in the telemedicine visit.  I understand that I have the right to withhold or withdraw my consent to the use of telemedicine in the course of my care at any time, without affecting my right to future care or treatment, and that the Practitioner or I may terminate the telemedicine visit at any time. I understand that I have the right to inspect all information obtained and/or recorded in the course of the telemedicine visit and may receive copies of available information for a reasonable fee.  I understand that some of the potential risks of receiving the Services via telemedicine include:  Delay or interruption in medical evaluation due to technological equipment  failure or disruption; Information transmitted may not be sufficient (e.g. poor resolution of images) to allow for appropriate medical decision making by the Practitioner; and/or  In rare instances, security protocols could fail, causing a breach of personal health information.  Furthermore, I acknowledge that it is my responsibility to provide information about my medical history, conditions and care that is complete and accurate to the best of my ability. I acknowledge that Practitioner's advice, recommendations, and/or decision may be based on factors not within their control, such as incomplete or inaccurate data provided by me or distortions of diagnostic images or specimens that may result from electronic transmissions. I understand that the practice of medicine is not an exact science and that Practitioner makes no warranties or guarantees regarding treatment outcomes. I acknowledge that a copy of this consent can be made available to me via my patient portal [facility] MyChart), or I can request a printed copy by calling the office of [facility].    I understand that my insurance will be billed for this visit.   I have read or had this consent read to me. I understand the contents of this consent, which adequately explains the benefits and risks of the Services being provided via telemedicine.  I have been provided ample opportunity to ask questions regarding this consent and the Services and have had my questions answered to my satisfaction. I give my informed consent for the services to be provided through the use of telemedicine in my medical care

## 2023-08-09 NOTE — Telephone Encounter (Signed)
   Name: Patricia Davidson  DOB: [birth year]  MRN: [id number]  Primary Cardiologist: Orbie Pyo, MD   Preoperative team, please contact this patient and set up a phone call appointment for further preoperative risk assessment. Please obtain consent and complete medication review. Thank you for your help.  I confirm that guidance regarding antiplatelet and oral anticoagulation therapy has been completed and, if necessary, noted below.  Per protocol patient can hold ASA 81 mg 7 days prior to procedure and should restart postprocedure when surgically safe and hemostasis is achieved.  I also confirmed the patient resides in the state of [place]. As per [facility] Medical Board telemedicine laws, the patient must reside in the state in which the provider is licensed.   Napoleon Form, Leodis Rains, NP 08/09/2023, 7:19 AM [facility]

## 2023-08-09 NOTE — Telephone Encounter (Signed)
Pt has been scheduled tele preop appt 09/09/23. Pt will be on a cruise the week before and asked to do tele after she is back. Med rec and consent are done.

## 2023-08-20 ENCOUNTER — Encounter (HOSPITAL_BASED_OUTPATIENT_CLINIC_OR_DEPARTMENT_OTHER): Payer: Self-pay

## 2023-08-20 ENCOUNTER — Ambulatory Visit (HOSPITAL_BASED_OUTPATIENT_CLINIC_OR_DEPARTMENT_OTHER): Admit: 2023-08-20 | Payer: Medicare PPO

## 2023-08-20 SURGERY — IMPLANTATION OF HYPOGLOSSAL NERVE STIMULATOR
Anesthesia: General

## 2023-09-06 ENCOUNTER — Ambulatory Visit: Payer: Medicare PPO | Attending: Cardiovascular Disease

## 2023-09-06 DIAGNOSIS — Z01810 Encounter for preprocedural cardiovascular examination: Secondary | ICD-10-CM

## 2023-09-06 NOTE — Progress Notes (Signed)
 Attempted to contact patient as part of preoperative protocol for preoperative appointment.  Several voice messages left.  Patient was instructed to contact office to set up appointment for a different date and time.  Thomasene Ripple. Hailea Eaglin NP-C     09/06/2023, 4:39 PM [facility] [address] Office [phone] Fax [phone]

## 2023-09-10 ENCOUNTER — Telehealth: Payer: Self-pay | Admitting: Internal Medicine

## 2023-09-10 ENCOUNTER — Encounter: Payer: Self-pay | Admitting: Emergency Medicine

## 2023-09-10 ENCOUNTER — Other Ambulatory Visit: Payer: Self-pay | Admitting: Radiology

## 2023-09-10 ENCOUNTER — Encounter (HOSPITAL_BASED_OUTPATIENT_CLINIC_OR_DEPARTMENT_OTHER): Payer: Self-pay | Admitting: *Deleted

## 2023-09-10 ENCOUNTER — Other Ambulatory Visit: Payer: Self-pay

## 2023-09-10 DIAGNOSIS — E119 Type 2 diabetes mellitus without complications: Secondary | ICD-10-CM

## 2023-09-10 MED ORDER — DEXCOM G7 RECEIVER DEVI: 0 refills | Status: DC

## 2023-09-10 MED ORDER — DEXCOM G7 SENSOR MISC: 4 refills | Status: DC

## 2023-09-10 NOTE — Telephone Encounter (Signed)
 New Message:       Patient was out of town and missed her Pre Op Phone appointment on 09-06-23.She would like to reschedule please.

## 2023-09-10 NOTE — Telephone Encounter (Signed)
 Once preop receive will input it into our system and forward to preop pool

## 2023-09-10 NOTE — Telephone Encounter (Signed)
 Error documentation have reached out to patient to reschedule no answer left a detailed message to call back

## 2023-09-11 NOTE — Telephone Encounter (Signed)
 I s/w the pt to reschedule missed tele appt, see notes. Pt tells me she is sick right now and running a fever, chills.    I asked the pt if she s/w the surgeon office yet. She said no. I suggested that she contact the surgeon office as not sure if surgeon will plan on surgery still for next week as she is sick and running a fever.    I assured the pt that I am going to reach out to the surgeon's office as well. We did set up tele 09/13/23 just in case surgery still planned for next week. If surgeon office comes back to preop that they are going to post pone surgery, we will move tele depending on surgeon office.       Note   You  Euphemia, Lingerfelt "Beth" [phone] minutes ago (11:02 AM)   Marvell Fuller, [id number] hours ago (5:10 PM)    Error documentation have reached out to patient to reschedule no answer left a detailed message to call back       Note   Marvell Fuller, [id number] hours ago (5:07 PM)    Once preop receive will input it into our system and forward to preop pool       Note   Laurence Ferrari routed conversation to [facility] Preop Callback23 hours ago (11:46 AM)   Dalia Heading M23 hours ago (11:46 AM)   SW New Message:             Patient was out of town and missed her Pre Op Phone appointment on 09-06-23.She would like to reschedule please.

## 2023-09-11 NOTE — Telephone Encounter (Signed)
 I s/w the pt to reschedule missed tele appt, see notes. Pt tells me she is sick right now and running a fever, chills.   I asked the pt if she s/w the surgeon office yet. She said no. I suggested that she contact the surgeon office as not sure if surgeon will plan on surgery still for next week as she is sick and running a fever.   I assured the pt that I am going to reach out to the surgeon's office as well. We did set up tele 09/13/23 just in case surgery still planned for next week. If surgeon office comes back to preop that they are going to post pone surgery, we will move tele depending on surgeon office.

## 2023-09-13 ENCOUNTER — Ambulatory Visit: Attending: Cardiology

## 2023-09-13 DIAGNOSIS — Z01810 Encounter for preprocedural cardiovascular examination: Secondary | ICD-10-CM
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
## 2023-09-13 NOTE — Telephone Encounter (Addendum)
 I s/w the pt today as to confirm if she was feeling better and if we are going to proceed with the tele appt for today for preop; see previous notes. Pt says she is still not feeling better, though her fevers are not as high as earlier this week, but still running low grade fevers and is congested. Pt states "concerned if I should do this now not feeling good". I assured the pt that I will call the surgeon office with an update and further advice if going to proceed or cancel surgery until pt's feels better.   I called Dr. Twana First office and explained the above. I will also send a secure chat to Dr. Leighton Roach nurse Leilani Able, CMA. I assured the pt that I will call her back as well as I will have surgeon office call her as well.   If surgery is being post poned I will cancel the tele appt for today.

## 2023-09-13 NOTE — Telephone Encounter (Signed)
 I called the pt and left a vm per Dr. Irene Pap:  if this is for preop clearance please keep the appt, sounds like she has a cold but this in itself does not change the fact that she needs preop clearance for her surgery.

## 2023-09-17 DIAGNOSIS — I10 Essential (primary) hypertension: Secondary | ICD-10-CM

## 2023-09-17 DIAGNOSIS — E119 Type 2 diabetes mellitus without complications: Secondary | ICD-10-CM

## 2023-10-01 DIAGNOSIS — G4733 Obstructive sleep apnea (adult) (pediatric): Secondary | ICD-10-CM | POA: Diagnosis not present

## 2023-10-03 ENCOUNTER — Encounter: Payer: Self-pay | Admitting: Emergency Medicine

## 2023-10-03 ENCOUNTER — Ambulatory Visit: Payer: Medicare PPO | Admitting: Emergency Medicine

## 2023-10-03 VITALS — BP 126/68 | HR 73 | Temp 98.3°F | Ht 71.0 in | Wt 234.0 lb

## 2023-10-03 DIAGNOSIS — E119 Type 2 diabetes mellitus without complications: Secondary | ICD-10-CM | POA: Diagnosis not present

## 2023-10-03 DIAGNOSIS — Z7985 Long-term (current) use of injectable non-insulin antidiabetic drugs: Secondary | ICD-10-CM

## 2023-10-03 DIAGNOSIS — I10 Essential (primary) hypertension: Secondary | ICD-10-CM

## 2023-10-03 DIAGNOSIS — G894 Chronic pain syndrome: Secondary | ICD-10-CM | POA: Diagnosis not present

## 2023-10-03 DIAGNOSIS — I700 Atherosclerosis of aorta: Secondary | ICD-10-CM | POA: Diagnosis not present

## 2023-10-03 LAB — POCT GLYCOSYLATED HEMOGLOBIN (HGB A1C): Hemoglobin A1C: 6 % — AB (ref 4.0–5.6)

## 2023-10-03 NOTE — Patient Instructions (Signed)
 Health Maintenance After Age 67 After age 4, you are at a higher risk for certain long-term diseases and infections as well as injuries from falls. Falls are a major cause of broken bones and head injuries in people who are older than age 47. Getting regular preventive care can help to keep you healthy and well. Preventive care includes getting regular testing and making lifestyle changes as recommended by your health care provider. Talk with your health care provider about: Which screenings and tests you should have. A screening is a test that checks for a disease when you have no symptoms. A diet and exercise plan that is right for you. What should I know about screenings and tests to prevent falls? Screening and testing are the best ways to find a health problem early. Early diagnosis and treatment give you the best chance of managing medical conditions that are common after age 37. Certain conditions and lifestyle choices may make you more likely to have a fall. Your health care provider may recommend: Regular vision checks. Poor vision and conditions such as cataracts can make you more likely to have a fall. If you wear glasses, make sure to get your prescription updated if your vision changes. Medicine review. Work with your health care provider to regularly review all of the medicines you are taking, including over-the-counter medicines. Ask your health care provider about any side effects that may make you more likely to have a fall. Tell your health care provider if any medicines that you take make you feel dizzy or sleepy. Strength and balance checks. Your health care provider may recommend certain tests to check your strength and balance while standing, walking, or changing positions. Foot health exam. Foot pain and numbness, as well as not wearing proper footwear, can make you more likely to have a fall. Screenings, including: Osteoporosis screening. Osteoporosis is a condition that causes  the bones to get weaker and break more easily. Blood pressure screening. Blood pressure changes and medicines to control blood pressure can make you feel dizzy. Depression screening. You may be more likely to have a fall if you have a fear of falling, feel depressed, or feel unable to do activities that you used to do. Alcohol use screening. Using too much alcohol can affect your balance and may make you more likely to have a fall. Follow these instructions at home: Lifestyle Do not drink alcohol if: Your health care provider tells you not to drink. If you drink alcohol: Limit how much you have to: 0-1 drink a day for women. 0-2 drinks a day for men. Know how much alcohol is in your drink. In the [place] one drink equals one 12 oz bottle of beer (355 mL), one 5 oz glass of wine (148 mL), or one 1 oz glass of hard liquor (44 mL). Do not use any products that contain nicotine or tobacco. These products include cigarettes, chewing tobacco, and vaping devices, such as e-cigarettes. If you need help quitting, ask your health care provider. Activity  Follow a regular exercise program to stay fit. This will help you maintain your balance. Ask your health care provider what types of exercise are appropriate for you. If you need a cane or walker, use it as recommended by your health care provider. Wear supportive shoes that have nonskid soles. Safety  Remove any tripping hazards, such as rugs, cords, and clutter. Install safety equipment such as grab bars in bathrooms and safety rails on stairs. Keep rooms and walkways  well-lit. General instructions Talk with your health care provider about your risks for falling. Tell your health care provider if: You fall. Be sure to tell your health care provider about all falls, even ones that seem minor. You feel dizzy, tiredness (fatigue), or off-balance. Take over-the-counter and prescription medicines only as told by your health care provider. These include  supplements. Eat a healthy diet and maintain a healthy weight. A healthy diet includes low-fat dairy products, low-fat (lean) meats, and fiber from whole grains, beans, and lots of fruits and vegetables. Stay current with your vaccines. Schedule regular health, dental, and eye exams. Summary Having a healthy lifestyle and getting preventive care can help to protect your health and wellness after age 11. Screening and testing are the best way to find a health problem early and help you avoid having a fall. Early diagnosis and treatment give you the best chance for managing medical conditions that are more common for people who are older than age 28. Falls are a major cause of broken bones and head injuries in people who are older than age 48. Take precautions to prevent a fall at home. Work with your health care provider to learn what changes you can make to improve your health and wellness and to prevent falls. This information is not intended to replace advice given to you by your health care provider. Make sure you discuss any questions you have with your health care provider. Document Revised: 10/31/2020 Document Reviewed: 10/31/2020 Elsevier Patient Education  2024 [employer].

## 2023-10-03 NOTE — Assessment & Plan Note (Signed)
 BP Readings from Last 3 Encounters:  10/03/23 126/68  08/07/23 (!) 157/82  06/07/23 120/73  Well-controlled hypertension Continue atenolol 50 mg daily and hydrochlorothiazide 12.5 mg daily Cardiovascular risks associated with hypertension discussed Dietary approaches to stop hypertension discussed Blood work done today Follow-up in 6 month

## 2023-10-03 NOTE — Assessment & Plan Note (Signed)
Chronic and well-controlled Follows up with pain management clinic on a regular basis. Chronic pain medications handled by their office.

## 2023-10-03 NOTE — Assessment & Plan Note (Signed)
Diet and nutrition discussed Lipid profile done today Continue atorvastatin 80 mg daily Also taking daily baby aspirin
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
## 2023-10-03 NOTE — Assessment & Plan Note (Signed)
 Well-controlled diabetes with hemoglobin A1c of 6.0 Continue weekly Ozempic 0.5 mg Not taking metformin due to side effects. Cardiovascular risks associated with diabetes discussed Diet and nutrition discussed Benefits of exercise discussed

## 2023-10-11 ENCOUNTER — Other Ambulatory Visit: Payer: Self-pay | Admitting: Emergency Medicine

## 2023-10-11 DIAGNOSIS — E119 Type 2 diabetes mellitus without complications: Secondary | ICD-10-CM

## 2023-10-21 ENCOUNTER — Encounter (INDEPENDENT_AMBULATORY_CARE_PROVIDER_SITE_OTHER): Admitting: Otolaryngology

## 2023-10-22 ENCOUNTER — Encounter (INDEPENDENT_AMBULATORY_CARE_PROVIDER_SITE_OTHER): Admitting: Otolaryngology

## 2023-10-29 ENCOUNTER — Ambulatory Visit (HOSPITAL_BASED_OUTPATIENT_CLINIC_OR_DEPARTMENT_OTHER): Admission: RE | Admit: 2023-10-29 | Payer: Medicare PPO | Source: Home / Self Care

## 2023-10-29 DIAGNOSIS — I10 Essential (primary) hypertension: Secondary | ICD-10-CM

## 2023-10-29 DIAGNOSIS — E119 Type 2 diabetes mellitus without complications: Secondary | ICD-10-CM

## 2023-10-29 SURGERY — INSERTION, HYPOGLOSSAL NERVE STIMULATOR
Anesthesia: General

## 2023-10-29 NOTE — Progress Notes (Unsigned)
 Cardiology Office Note:   Date:  10/30/2023  ID:  Patricia Davidson, DOB [birth year], MRN [id number] PCP:  Elvira Hammersmith, MD  [facility] Providers Cardiologist:  Alyssa Backbone, MD Referring MD: Elvira Hammersmith, *  Chief Complaint/Reason for Referral: Follow-up for palpitations and hypertension ASSESSMENT:    1. Elevated Lp(a)   2. Aortic atherosclerosis (HCC)   3. Essential hypertension   4. Diastolic dysfunction   5. Palpitations   6. BMI 36.0-36.9,adult   7. Type 2 diabetes mellitus without complication, without long-term current use of insulin (HCC)   8. Hyperlipidemia associated with type 2 diabetes mellitus (HCC)     PLAN:   In order of problems listed above: Elevated LP(a): LDL was 49 in October.  Continue current therapy with atorvastatin  80 mg, Zetia  10 mg. Aortic atherosclerosis: Continue aspirin 81 mg, atorvastatin  80 mg, Zetia  10 mg. Hypertension: Change atenolol  to Toprol  as below; given diabetes we will stop hydrochlorothiazide  12.5 mg and start losartan 12.5 mg.   Diastolic dysfunction: Start Jardiance 10 mg and losartan 12.5 mg Palpitations: Seems infrequent.  Will change atenolol  to Toprol  50 mg at bedtime. Elevated BMI: Continue Ozempic  0.25 mg q. weekly. T2DM:  Continue ASA 81, atorvastatin  80mg , start Jardiance 10mg , start losartan 12.5 mg and continue Ozempic  0.25 mg q. weekly            Dispo:  Return in about 6 months (around 05/01/2024).      Medication Adjustments/Labs and Tests Ordered: Current medicines are reviewed at length with the patient today.  Concerns regarding medicines are outlined above.  The following changes have been made:     Labs/tests ordered: Orders Placed This Encounter  Procedures   EKG 12-Lead    Medication Changes: Meds ordered this encounter  Medications   empagliflozin (JARDIANCE) 10 MG TABS tablet    Sig: Take 1 tablet (10 mg total) by mouth daily before breakfast.    Dispense:  90 tablet     Refill:  3   metoprolol  succinate (TOPROL -XL) 50 MG 24 hr tablet    Sig: Take 1 tablet (50 mg total) by mouth daily. Take with or immediately following a meal.    Dispense:  90 tablet    Refill:  3   losartan (COZAAR) 25 MG tablet    Sig: Take 0.5 tablets (12.5 mg total) by mouth daily.    Dispense:  45 tablet    Refill:  3    Current medicines are reviewed at length with the patient today.  The patient does not have concerns regarding medicines.  I spent 35 minutes reviewing all clinical data during and prior to this visit including all relevant imaging studies, laboratories, clinical information from other health systems and prior notes from both Cardiology and other specialties, interviewing the patient, conducting a complete physical examination, and coordinating care in order to formulate a comprehensive and personalized evaluation and treatment plan.   History of Present Illness:      FOCUSED PROBLEM LIST:   Hyperlipidemia LP(a) 118 Aortic atherosclerosis  Renal CT 2020  Hypertension Diastolic dysfunction G1 DD, no significant valve problems, EF 55 to 60% TTE 2023 Prior cardiac evaluation (~2004) [place], [place] >> reassuring coronary angiogram and echocardiogram BMI 36 OSA on CPAP Followed by Dr. Micael Adas Palpitations Short duration SVT, rare PACs and PVCs monitor 2024 T2DM Diet controlled   July 2023:  The patient is a 68 y.o. female with the indicated medical history here for recommendations regarding palpitations.  Laboratories were drawn  which were unremarkable including a normal TSH.  She tells me she has had palpitations at times.  Maybe once every few weeks she will have palpitations that last a few hours.  Occasionally she will have extra beats that are bothersome and not associate with chest pain or shortness of breath.  She has not had any significant presyncope or syncope.  She denies any paroxysmal nocturnal dyspnea, or orthopnea.  She denies any peripheral edema.   She has not required any emergency room visits or hospitalizations.   She did have an extensive work-up about 20 years ago for palpitations at [facility] that included an echocardiogram, Holter monitor and coronary angiography study.  She tells me all of these studies were reassuring.  She is also diagnosed with obstructive sleep apnea prior to the pandemic.  She was prescribed CPAP but then her sleep medicine doctor retired.  She has been using a CPAP machine but is not being actively managed.  Plan:  Echocardiogram, monitor, increase atorvastatin  to 80 mg and check lipid panel, LFTs, LP(a) in 2 months; refer to sleep medicine.   May 2024: Echocardiogram was done which demonstrated reassuring findings.  Monitor was started however it was never returned.  Her LDL was above goal.  She was started on Zetia  10 mg and referred to Dr. Maximo Spar due to elevated LP(a).  Repeat LDL was 58.  She was seen in our office last November for preoperative evaluation prior to orthopedic surgery.  She continues to do well from a cardiovascular perspective.  She denies any chest pain.  She has noticed some palpitations that improved in frequency but still present at times.  She was unable to wear her monitor because it was misplaced when she was moving.  She has a monitor unit with her today.  She has not yet worn it.  She has been unable to lose a significant amount of weight because she is limited by low back pain.  She is seeing orthopedics regarding this issue.  She fortunately is not required any hospitalizations or emergency room visits for cardiac issues.  Obtain monitor.  May 2025:  Patient consents to use of AI scribe. In the interim the patient's monitor demonstrated reassuring findings.  She was seen by cardiology in March of this year for preprocedural assessment.  She was doing well at that time.  She experiences palpitations sporadically, approximately once a month or every other month. These episodes  resolve on her own and are currently managed with atenolol , which helps control the palpitations.  In October, she was diagnosed with diabetes and started on metformin  and Ozempic . She could not tolerate metformin  but continues with Ozempic , noting weight loss as a side effect. She mentions that palpitations seem to occur a day or two after taking Ozempic , although she questions if this is coincidental. Her last checkup in April showed an A1c in the normal range, indicating good diabetes control. She is also on aspirin therapy.  She has a dental issue with a painful tooth scheduled for extraction next Tuesday. She has been using pain medication and Oragel for relief. She has a history of joint replacements and was previously advised to take antibiotics before dental procedures. She recalls being told she had a heart murmur many years ago, which led to the recommendation for antibiotics at that time.  No significant increase in palpitations or other concerning symptoms.          Current Medications: Current Meds  Medication Sig   amphetamine-dextroamphetamine (  ADDERALL) 15 MG tablet Take 15 mg by mouth daily.   aspirin EC 81 MG tablet Take 81 mg by mouth at bedtime. Swallow whole.   atorvastatin  (LIPITOR) 80 MG tablet Take 1 tablet (80 mg total) by mouth daily.   DULoxetine  (CYMBALTA ) 30 MG capsule Take 90 mg by mouth at bedtime.   empagliflozin (JARDIANCE) 10 MG TABS tablet Take 1 tablet (10 mg total) by mouth daily before breakfast.   ezetimibe  (ZETIA ) 10 MG tablet TAKE ONE TABLET DAILY   HYDROcodone -acetaminophen  (NORCO) 10-325 MG tablet Take 2 tablets by mouth every 6 (six) hours as needed for severe pain or moderate pain.   losartan (COZAAR) 25 MG tablet Take 0.5 tablets (12.5 mg total) by mouth daily.   metoprolol  succinate (TOPROL -XL) 50 MG 24 hr tablet Take 1 tablet (50 mg total) by mouth daily. Take with or immediately following a meal.   morphine  (MS CONTIN ) 60 MG 12 hr tablet Take  60 mg by mouth every 12 (twelve) hours.   OZEMPIC , 0.25 OR 0.5 MG/DOSE, 2 MG/3ML SOPN inject 0.25mg  into THE SKIN ONCE a WEEK. increase DOSE TO 0.5mg  WEEKLY AFTER TWO WEEKS if side effects tolerated   [DISCONTINUED] atenolol  (TENORMIN ) 50 MG tablet Take 1 tablet (50 mg total) by mouth at bedtime.   [DISCONTINUED] hydrochlorothiazide  (MICROZIDE ) 12.5 MG capsule Take 1 capsule (12.5 mg total) by mouth at bedtime.     Review of Systems:   Please see the history of present illness.    All other systems reviewed and are negative.     EKGs/Labs/Other Test Reviewed:   EKG: 2023 sinus rhythm nonspecific ST and T wave changes.  EKG Interpretation Date/Time:  Wednesday Oct 30 2023 11:20:22 EDT Ventricular Rate:  70 PR Interval:  168 QRS Duration:  86 QT Interval:  402 QTC Calculation: 434 R Axis:   69  Text Interpretation: Normal sinus rhythm Normal ECG When compared with ECG of 30-Nov-2020 11:07, No significant change was found Confirmed by Alyssa Backbone ([id number]) on 10/30/2023 11:23:21 AM         Risk Assessment/Calculations:          Physical Exam:   VS:  BP 102/62   Pulse 70   Ht 5\' 11"  (1.803 m)   Wt 234 lb 12.8 oz (106.5 kg)   SpO2 98%   BMI 32.75 kg/m        Wt Readings from Last 3 Encounters:  10/30/23 234 lb 12.8 oz (106.5 kg)  10/03/23 234 lb (106.1 kg)  06/07/23 244 lb (110.7 kg)      GENERAL:  No apparent distress, AOx3 HEENT:  No carotid bruits, +2 carotid impulses, no scleral icterus CAR: RRR no murmur, gallops, rubs, or thrills RES:  Clear to auscultation bilaterally ABD:  Soft, nontender, nondistended, positive bowel sounds x 4 VASC:  +2 radial pulses, +2 carotid pulses NEURO:  CN 2-12 grossly intact; motor and sensory grossly intact PSYCH:  No active depression or anxiety EXT:  No edema, ecchymosis, or cyanosis  Signed, Kyra Phy, MD  10/30/2023 12:09 PM    [facility] [address] [place], [place], [place]  [address] Phone: [phone]; Fax: [phone]   Note:  This document was prepared using Dragon voice recognition software and may include unintentional dictation errors.

## 2023-10-30 ENCOUNTER — Ambulatory Visit: Attending: Internal Medicine | Admitting: Internal Medicine

## 2023-10-30 ENCOUNTER — Encounter: Payer: Self-pay | Admitting: Internal Medicine

## 2023-10-30 VITALS — BP 102/62 | HR 70 | Ht 71.0 in | Wt 234.8 lb

## 2023-10-30 DIAGNOSIS — E1169 Type 2 diabetes mellitus with other specified complication: Secondary | ICD-10-CM

## 2023-10-30 DIAGNOSIS — Z6836 Body mass index (BMI) 36.0-36.9, adult: Secondary | ICD-10-CM

## 2023-10-30 DIAGNOSIS — R002 Palpitations: Secondary | ICD-10-CM

## 2023-10-30 DIAGNOSIS — E785 Hyperlipidemia, unspecified: Secondary | ICD-10-CM

## 2023-10-30 DIAGNOSIS — E7841 Elevated Lipoprotein(a): Secondary | ICD-10-CM | POA: Diagnosis not present

## 2023-10-30 DIAGNOSIS — E119 Type 2 diabetes mellitus without complications: Secondary | ICD-10-CM

## 2023-10-30 DIAGNOSIS — I10 Essential (primary) hypertension: Secondary | ICD-10-CM | POA: Diagnosis not present

## 2023-10-30 DIAGNOSIS — I5189 Other ill-defined heart diseases: Secondary | ICD-10-CM

## 2023-10-30 DIAGNOSIS — I700 Atherosclerosis of aorta: Secondary | ICD-10-CM

## 2023-10-30 MED ORDER — LOSARTAN POTASSIUM 25 MG PO TABS: 12.5000 mg | ORAL_TABLET | Freq: Every day | ORAL | 3 refills | Status: AC

## 2023-10-30 MED ORDER — EMPAGLIFLOZIN 10 MG PO TABS: 10.0000 mg | ORAL_TABLET | Freq: Every day | ORAL | 3 refills | Status: AC

## 2023-10-30 MED ORDER — METOPROLOL SUCCINATE ER 50 MG PO TB24: 50.0000 mg | ORAL_TABLET | Freq: Every day | ORAL | 3 refills | Status: AC

## 2023-10-30 NOTE — Patient Instructions (Signed)
 Medication Instructions:  Your physician has recommended you make the following change in your medication: STOP ATENOLOL  STOP HYDROCHLOROTHIAZIDE  START TOPROL  XL 50 MG AT BEDTIME START JARDIANCE 10 MG DAILY  START LOSARTAN 12.5 MG DAILY  *If you need a refill on your cardiac medications before your next appointment, please call your pharmacy*  Lab Work: NONE If you have labs (blood work) drawn today and your tests are completely normal, you will receive your results only by: MyChart Message (if you have MyChart) OR A paper copy in the mail If you have any lab test that is abnormal or we need to change your treatment, we will call you to review the results.  Testing/Procedures: NONE  Follow-Up: At [facility], you and your health needs are our priority.  As part of our continuing mission to provide you with exceptional heart care, our providers are all part of one team.  This team includes your primary Cardiologist (physician) and Advanced Practice Providers or APPs (Physician Assistants and Nurse Practitioners) who all work together to provide you with the care you need, when you need it.  Your next appointment:   6 month(s)  Provider:   One of our Advanced Practice Providers (APPs): Melita Springer, PA-C  Friddie Jetty, NP Evaline Hill, NP  Theotis Flake, PA-C Lawana Pray, NP  Willis Harter, PA-C Lovette Rud, PA-C  [place], PA-C Ernest Dick, NP  Marlana Silvan, NP Marcie Sever, PA-C  Laquita Plant, PA-C    Dayna Dunn, PA-C  Scott Weaver, PA-C Palmer Bobo, NP Katlyn West, NP Callie Goodrich, PA-C  Evan Williams, PA-C Sheng Haley, PA-C  Xika Zhao, NP Kathleen Johnson, PA-C     We recommend signing up for the patient portal called "MyChart".  Sign up information is provided on this After Visit Summary.  MyChart is used to connect with patients for Virtual Visits (Telemedicine).  Patients are able to view lab/test results, encounter notes, upcoming  appointments, etc.  Non-urgent messages can be sent to your provider as well.   To learn more about what you can do with MyChart, go to [email or web address].   Other Instructions

## 2023-11-12 DIAGNOSIS — R52 Pain, unspecified: Secondary | ICD-10-CM | POA: Diagnosis not present

## 2023-11-12 DIAGNOSIS — G9050 Complex regional pain syndrome I, unspecified: Secondary | ICD-10-CM | POA: Diagnosis not present

## 2023-11-13 ENCOUNTER — Ambulatory Visit (INDEPENDENT_AMBULATORY_CARE_PROVIDER_SITE_OTHER): Admitting: Otolaryngology

## 2023-12-23 ENCOUNTER — Other Ambulatory Visit: Payer: Self-pay | Admitting: Internal Medicine

## 2024-01-09 ENCOUNTER — Other Ambulatory Visit: Payer: Self-pay | Admitting: Internal Medicine

## 2024-01-29 ENCOUNTER — Other Ambulatory Visit: Payer: Self-pay | Admitting: Emergency Medicine

## 2024-01-29 DIAGNOSIS — Z1231 Encounter for screening mammogram for malignant neoplasm of breast: Secondary | ICD-10-CM

## 2024-01-30 DIAGNOSIS — H0100B Unspecified blepharitis left eye, upper and lower eyelids: Secondary | ICD-10-CM | POA: Diagnosis not present

## 2024-01-30 DIAGNOSIS — H25813 Combined forms of age-related cataract, bilateral: Secondary | ICD-10-CM | POA: Diagnosis not present

## 2024-01-30 DIAGNOSIS — H52223 Regular astigmatism, bilateral: Secondary | ICD-10-CM | POA: Diagnosis not present

## 2024-01-30 DIAGNOSIS — H524 Presbyopia: Secondary | ICD-10-CM | POA: Diagnosis not present

## 2024-01-30 DIAGNOSIS — H5203 Hypermetropia, bilateral: Secondary | ICD-10-CM | POA: Diagnosis not present

## 2024-01-30 DIAGNOSIS — H0100A Unspecified blepharitis right eye, upper and lower eyelids: Secondary | ICD-10-CM | POA: Diagnosis not present

## 2024-02-27 ENCOUNTER — Ambulatory Visit
Admission: RE | Admit: 2024-02-27 | Discharge: 2024-02-27 | Disposition: A | Discharge status: Home / Self Care | Source: Ambulatory Visit | Attending: Emergency Medicine | Admitting: Emergency Medicine

## 2024-02-27 DIAGNOSIS — Z1231 Encounter for screening mammogram for malignant neoplasm of breast: Secondary | ICD-10-CM

## 2024-03-04 ENCOUNTER — Ambulatory Visit: Payer: Self-pay | Admitting: Emergency Medicine

## 2024-03-09 ENCOUNTER — Ambulatory Visit (INDEPENDENT_AMBULATORY_CARE_PROVIDER_SITE_OTHER): Payer: Medicare PPO

## 2024-03-09 VITALS — BP 130/62 | HR 80 | Ht 71.0 in | Wt 217.6 lb

## 2024-03-09 DIAGNOSIS — Z0000 Encounter for general adult medical examination without abnormal findings: Secondary | ICD-10-CM
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
## 2024-03-09 NOTE — Patient Instructions (Addendum)
 Ms. Dandridge,  Thank you for taking the time for your Medicare Wellness Visit. I appreciate your continued commitment to your health goals. Please review the care plan we discussed, and feel free to reach out if I can assist you further.  Medicare recommends these wellness visits once per year to help you and your care team stay ahead of potential health issues. These visits are designed to focus on prevention, allowing your provider to concentrate on managing your acute and chronic conditions during your regular appointments.  Please note that Annual Wellness Visits do not include a physical exam. Some assessments may be limited, especially if the visit was conducted virtually. If needed, we may recommend a separate in-person follow-up with your provider.  Ongoing Care Seeing your primary care provider every 3 to 6 months helps us  monitor your health and provide consistent, personalized care.   Referrals If a referral was made during today's visit and you haven't received any updates within two weeks, please contact the referred provider directly to check on the status.  Recommended Screenings:  Health Maintenance  Topic Date Due   Complete foot exam   Never done   Yearly kidney health urinalysis for diabetes  Never done   Flu Shot  01/24/2024   COVID-19 Vaccine (1 - 2024-25 season) Never done   Yearly kidney function blood test for diabetes  04/03/2024   Zoster (Shingles) Vaccine (1 of 2) 06/23/2024*   Hemoglobin A1C  04/03/2024   Eye exam for diabetics  02/04/2025   Medicare Annual Wellness Visit  03/09/2025   Breast Cancer Screening  02/26/2026   Colon Cancer Screening  04/21/2033   Pneumococcal Vaccine for age over 39  Completed   DEXA scan (bone density measurement)  Completed   Hepatitis C Screening  Completed   HPV Vaccine  Aged Out   Meningitis B Vaccine  Aged Out   DTaP/Tdap/Td vaccine  Discontinued  *Topic was postponed. The date shown is not the original due date.        09/10/2023   12:31 PM  Advanced Directives  Does Patient Have a Medical Advance Directive? No   Advance Care Planning is important because it: Ensures you receive medical care that aligns with your values, goals, and preferences. Provides guidance to your family and loved ones, reducing the emotional burden of decision-making during critical moments.  Vision: Annual vision screenings are recommended for early detection of glaucoma, cataracts, and diabetic retinopathy. These exams can also reveal signs of chronic conditions such as diabetes and high blood pressure.  Dental: Annual dental screenings help detect early signs of oral cancer, gum disease, and other conditions linked to overall health, including heart disease and diabetes.

## 2024-03-18 DIAGNOSIS — R109 Unspecified abdominal pain: Secondary | ICD-10-CM | POA: Diagnosis not present

## 2024-03-18 DIAGNOSIS — G43719 Chronic migraine without aura, intractable, without status migrainosus: Secondary | ICD-10-CM | POA: Diagnosis not present

## 2024-03-18 DIAGNOSIS — G894 Chronic pain syndrome: Secondary | ICD-10-CM | POA: Diagnosis not present

## 2024-03-18 DIAGNOSIS — G8929 Other chronic pain: Secondary | ICD-10-CM | POA: Diagnosis not present

## 2024-03-18 DIAGNOSIS — R52 Pain, unspecified: Secondary | ICD-10-CM | POA: Diagnosis not present

## 2024-04-02 ENCOUNTER — Other Ambulatory Visit: Payer: Self-pay | Admitting: Emergency Medicine

## 2024-04-02 DIAGNOSIS — E119 Type 2 diabetes mellitus without complications: Secondary | ICD-10-CM

## 2024-04-06 ENCOUNTER — Ambulatory Visit: Payer: Self-pay | Admitting: Emergency Medicine

## 2024-04-06 ENCOUNTER — Ambulatory Visit: Admitting: Emergency Medicine

## 2024-04-06 ENCOUNTER — Encounter: Payer: Self-pay | Admitting: Emergency Medicine

## 2024-04-06 VITALS — BP 124/78 | HR 82 | Temp 97.7°F | Ht 71.0 in | Wt 215.0 lb

## 2024-04-06 DIAGNOSIS — Z7984 Long term (current) use of oral hypoglycemic drugs: Secondary | ICD-10-CM | POA: Diagnosis not present

## 2024-04-06 DIAGNOSIS — I152 Hypertension secondary to endocrine disorders: Secondary | ICD-10-CM | POA: Diagnosis not present

## 2024-04-06 DIAGNOSIS — G894 Chronic pain syndrome: Secondary | ICD-10-CM | POA: Diagnosis not present

## 2024-04-06 DIAGNOSIS — E785 Hyperlipidemia, unspecified: Secondary | ICD-10-CM | POA: Diagnosis not present

## 2024-04-06 DIAGNOSIS — E1159 Type 2 diabetes mellitus with other circulatory complications: Secondary | ICD-10-CM

## 2024-04-06 DIAGNOSIS — G4733 Obstructive sleep apnea (adult) (pediatric): Secondary | ICD-10-CM

## 2024-04-06 DIAGNOSIS — I700 Atherosclerosis of aorta: Secondary | ICD-10-CM | POA: Diagnosis not present

## 2024-04-06 DIAGNOSIS — Z23 Encounter for immunization: Secondary | ICD-10-CM | POA: Diagnosis not present

## 2024-04-06 DIAGNOSIS — E1169 Type 2 diabetes mellitus with other specified complication: Secondary | ICD-10-CM

## 2024-04-06 DIAGNOSIS — Z7985 Long-term (current) use of injectable non-insulin antidiabetic drugs: Secondary | ICD-10-CM

## 2024-04-06 LAB — COMPREHENSIVE METABOLIC PANEL WITH GFR
ALT: 11 U/L (ref 0–35)
AST: 13 U/L (ref 0–37)
Albumin: 4.1 g/dL (ref 3.5–5.2)
Alkaline Phosphatase: 89 U/L (ref 39–117)
BUN: 9 mg/dL (ref 6–23)
CO2: 29 meq/L (ref 19–32)
Calcium: 9.1 mg/dL (ref 8.4–10.5)
Chloride: 106 meq/L (ref 96–112)
Creatinine, Ser: 0.62 mg/dL (ref 0.40–1.20)
GFR: 92.03 mL/min (ref >60.00)
Glucose, Bld: 135 mg/dL — ABNORMAL HIGH (ref 70–99)
Potassium: 3.8 meq/L (ref 3.5–5.1)
Sodium: 144 meq/L (ref 135–145)
Total Bilirubin: 0.4 mg/dL (ref 0.2–1.2)
Total Protein: 6.9 g/dL (ref 6.0–8.3)

## 2024-04-06 LAB — CBC WITH DIFFERENTIAL/PLATELET
Basophils Absolute: 0 K/uL (ref 0.0–0.1)
Basophils Relative: 0.9 % (ref 0.0–3.0)
Eosinophils Absolute: 0.1 K/uL (ref 0.0–0.7)
Eosinophils Relative: 2.9 % (ref 0.0–5.0)
HCT: 35.6 % — ABNORMAL LOW (ref 36.0–46.0)
Hemoglobin: 11.5 g/dL — ABNORMAL LOW (ref 12.0–15.0)
Lymphocytes Relative: 38 % (ref 12.0–46.0)
Lymphs Abs: 2 K/uL (ref 0.7–4.0)
MCHC: 32.3 g/dL (ref 30.0–36.0)
MCV: 94.8 fl (ref 78.0–100.0)
Monocytes Absolute: 0.5 K/uL (ref 0.1–1.0)
Monocytes Relative: 10.4 % (ref 3.0–12.0)
Neutro Abs: 2.5 K/uL (ref 1.4–7.7)
Neutrophils Relative %: 47.8 % (ref 43.0–77.0)
Platelets: 250 K/uL (ref 150.0–400.0)
RBC: 3.76 Mil/uL — ABNORMAL LOW (ref 3.87–5.11)
RDW: 14.9 % (ref 11.5–15.5)
WBC: 5.2 K/uL (ref 4.0–10.5)

## 2024-04-06 LAB — LIPID PANEL
Cholesterol: 117 mg/dL (ref 0–200)
HDL: 53 mg/dL (ref >39.00)
LDL Cholesterol: 47 mg/dL (ref 0–99)
NonHDL: 64.31
Total CHOL/HDL Ratio: 2
Triglycerides: 87 mg/dL (ref 0.0–149.0)
VLDL: 17.4 mg/dL (ref 0.0–40.0)

## 2024-04-06 LAB — HEMOGLOBIN A1C: Hgb A1c MFr Bld: 6.3 % (ref 4.6–6.5)

## 2024-04-06 LAB — MICROALBUMIN / CREATININE URINE RATIO
Creatinine,U: 100.7 mg/dL
Microalb Creat Ratio: 36.4 mg/g — ABNORMAL HIGH (ref 0.0–30.0)
Microalb, Ur: 3.7 mg/dL — ABNORMAL HIGH (ref 0.0–1.9)
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
## 2024-04-06 NOTE — Assessment & Plan Note (Signed)
 Well-controlled diabetes with hemoglobin A1c of 6.0 Continue weekly Ozempic 0.5 mg Not taking metformin due to side effects. Cardiovascular risks associated with diabetes discussed Diet and nutrition discussed Benefits of exercise discussed

## 2024-04-06 NOTE — Assessment & Plan Note (Signed)
 BP Readings from Last 3 Encounters:  04/06/24 124/78  03/09/24 130/62  10/30/23 102/62   Lab Results  Component Value Date   HGBA1C 6.0 (A) 10/03/2023  Well-controlled hypertension Well-controlled diabetes. Continue weekly Ozempic  0.5 mg and Jardiance  10 mg daily Continue atenolol  50 mg daily and hydrochlorothiazide  12.5 mg daily Cardiovascular risks associated with hypertension discussed Dietary approaches to stop hypertension discussed Blood work done today Follow-up in 6 month

## 2024-04-06 NOTE — Assessment & Plan Note (Signed)
Chronic and well-controlled Follows up with pain management clinic on a regular basis. Chronic pain medications handled by their office.

## 2024-04-06 NOTE — Assessment & Plan Note (Signed)
Stable and well-controlled on CPAP treatment.

## 2024-04-06 NOTE — Patient Instructions (Signed)
 Health Maintenance After Age 67 After age 27, you are at a higher risk for certain long-term diseases and infections as well as injuries from falls. Falls are a major cause of broken bones and head injuries in people who are older than age 73. Getting regular preventive care can help to keep you healthy and well. Preventive care includes getting regular testing and making lifestyle changes as recommended by your health care provider. Talk with your health care provider about: Which screenings and tests you should have. A screening is a test that checks for a disease when you have no symptoms. A diet and exercise plan that is right for you. What should I know about screenings and tests to prevent falls? Screening and testing are the best ways to find a health problem early. Early diagnosis and treatment give you the best chance of managing medical conditions that are common after age 90. Certain conditions and lifestyle choices may make you more likely to have a fall. Your health care provider may recommend: Regular vision checks. Poor vision and conditions such as cataracts can make you more likely to have a fall. If you wear glasses, make sure to get your prescription updated if your vision changes. Medicine review. Work with your health care provider to regularly review all of the medicines you are taking, including over-the-counter medicines. Ask your health care provider about any side effects that may make you more likely to have a fall. Tell your health care provider if any medicines that you take make you feel dizzy or sleepy. Strength and balance checks. Your health care provider may recommend certain tests to check your strength and balance while standing, walking, or changing positions. Foot health exam. Foot pain and numbness, as well as not wearing proper footwear, can make you more likely to have a fall. Screenings, including: Osteoporosis screening. Osteoporosis is a condition that causes  the bones to get weaker and break more easily. Blood pressure screening. Blood pressure changes and medicines to control blood pressure can make you feel dizzy. Depression screening. You may be more likely to have a fall if you have a fear of falling, feel depressed, or feel unable to do activities that you used to do. Alcohol  use screening. Using too much alcohol  can affect your balance and may make you more likely to have a fall. Follow these instructions at home: Lifestyle Do not drink alcohol  if: Your health care provider tells you not to drink. If you drink alcohol : Limit how much you have to: 0-1 drink a day for women. 0-2 drinks a day for men. Know how much alcohol  is in your drink. In the [place] one drink equals one 12 oz bottle of beer (355 mL), one 5 oz glass of wine (148 mL), or one 1 oz glass of hard liquor (44 mL). Do not use any products that contain nicotine or tobacco. These products include cigarettes, chewing tobacco, and vaping devices, such as e-cigarettes. If you need help quitting, ask your health care provider. Activity  Follow a regular exercise program to stay fit. This will help you maintain your balance. Ask your health care provider what types of exercise are appropriate for you. If you need a cane or walker, use it as recommended by your health care provider. Wear supportive shoes that have nonskid soles. Safety  Remove any tripping hazards, such as rugs, cords, and clutter. Install safety equipment such as grab bars in bathrooms and safety rails on stairs. Keep rooms and walkways  well-lit. General instructions Talk with your health care provider about your risks for falling. Tell your health care provider if: You fall. Be sure to tell your health care provider about all falls, even ones that seem minor. You feel dizzy, tiredness (fatigue), or off-balance. Take over-the-counter and prescription medicines only as told by your health care provider. These include  supplements. Eat a healthy diet and maintain a healthy weight. A healthy diet includes low-fat dairy products, low-fat (lean) meats, and fiber from whole grains, beans, and lots of fruits and vegetables. Stay current with your vaccines. Schedule regular health, dental, and eye exams. Summary Having a healthy lifestyle and getting preventive care can help to protect your health and wellness after age 15. Screening and testing are the best way to find a health problem early and help you avoid having a fall. Early diagnosis and treatment give you the best chance for managing medical conditions that are more common for people who are older than age 42. Falls are a major cause of broken bones and head injuries in people who are older than age 64. Take precautions to prevent a fall at home. Work with your health care provider to learn what changes you can make to improve your health and wellness and to prevent falls. This information is not intended to replace advice given to you by your health care provider. Make sure you discuss any questions you have with your health care provider. Document Revised: 10/31/2020 Document Reviewed: 10/31/2020 Elsevier Patient Education  2024 [employer].

## 2024-04-06 NOTE — Assessment & Plan Note (Signed)
Diet and nutrition discussed Lipid profile done today Continue atorvastatin 80 mg daily Also taking daily baby aspirin

## 2024-04-07 NOTE — Telephone Encounter (Signed)
 No concerns.  Kidney function test in the blood is normal.  Jardiance  will continue protecting your kidneys from leaking more protein.

## 2024-04-30 NOTE — Progress Notes (Unsigned)
    OFFICE NOTE:    Date:  05/01/2024  ID:  Patricia Davidson, DOB 1956-06-09, MRN [id number] PCP: Purcell Emil Schanz, MD  [facility] Providers Cardiologist:  Lurena MARLA Red, MD Sleep Medicine:  Wilbert Bihari, MD         Palpitations  TTE 03/06/2022: EF 55-60, no RWMA, G1 DD, normal RVSF, normal PASP, mild MR Monitor 04/2023: NSR, average heart rate 74, 1 episode SVT, rare PACs and PVCs Hypertension Diabetes mellitus Hyperlipidemia Lp(a) 118.1  Aortic atherosclerosis OSA  FHx CAD       Discussed the use of AI scribe software for clinical note transcription with the patient, who gave verbal consent to proceed. History of Present Illness Patricia Davidson is a 68 y.o. female for follow-up of hyperlipidemia.  Last seen in May 2025.  She has not had chest pain, significant shortness of breath or syncope.  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     ROS-See HPI    Studies Reviewed:       Results LABS LDL: 47 (03/2024)           Physical Exam:  VS:  BP 126/70   Pulse 78   Ht 5' 11.5 (1.816 m)   Wt 212 lb 6.4 oz (96.3 kg)   SpO2 98%   BMI 29.21 kg/m        Wt Readings from Last 3 Encounters:  05/01/24 212 lb 6.4 oz (96.3 kg)  04/06/24 215 lb (97.5 kg)  03/09/24 217 lb 9.6 oz (98.7 kg)    Constitutional:      Appearance: Healthy appearance. Not in distress.  Neck:     Vascular: No carotid bruit.  Pulmonary:     Breath sounds: Normal breath sounds. No wheezing. No rales.  Cardiovascular:     Normal rate. Regular rhythm.     Murmurs: There is no murmur.  Edema:    Peripheral edema absent.       Assessment and Plan:    Assessment & Plan Dyslipidemia associated with type 2 diabetes mellitus (HCC) LDL cholesterol is well-controlled at 47 mg/dL. - Continue Lipitor 80 mg daily - Continue Zetia  10 mg  daily Aortic atherosclerosis Continue ASA 81 mg daily, Lipitor 80 mg daily. Essential hypertension Blood pressure is controlled. - Continue losartan  12.5 mg daily - Continue topiramate 50 mg daily        Dispo:  Return in about 1 year (around 05/01/2025) for Routine Follow Up, w/ Dr. Red, or Glendia Ferrier, PA-C.  Signed, Glendia Ferrier, PA-C

## 2024-05-01 ENCOUNTER — Encounter: Payer: Self-pay | Admitting: Physician Assistant

## 2024-05-01 ENCOUNTER — Ambulatory Visit: Attending: Physician Assistant | Admitting: Physician Assistant

## 2024-05-01 VITALS — BP 126/70 | HR 78 | Ht 71.5 in | Wt 212.4 lb

## 2024-05-01 DIAGNOSIS — I10 Essential (primary) hypertension: Secondary | ICD-10-CM

## 2024-05-01 DIAGNOSIS — E785 Hyperlipidemia, unspecified: Secondary | ICD-10-CM | POA: Diagnosis not present

## 2024-05-01 DIAGNOSIS — E1169 Type 2 diabetes mellitus with other specified complication: Secondary | ICD-10-CM | POA: Diagnosis not present

## 2024-05-01 DIAGNOSIS — I700 Atherosclerosis of aorta: Secondary | ICD-10-CM | POA: Diagnosis not present

## 2024-05-01 NOTE — Assessment & Plan Note (Signed)
 Continue ASA 81 mg daily, Lipitor 80 mg daily.

## 2024-05-01 NOTE — Patient Instructions (Signed)
 Medication Instructions:  No changes *If you need a refill on your cardiac medications before your next appointment, please call your pharmacy*  Follow-Up: At [facility], you and your health needs are our priority.  As part of our continuing mission to provide you with exceptional heart care, our providers are all part of one team.  This team includes your primary Cardiologist (physician) and Advanced Practice Providers or APPs (Physician Assistants and Nurse Practitioners) who all work together to provide you with the care you need, when you need it.  Your next appointment:   12 month(s)  Provider:   Arun K Thukkani, MD or Glendia Ferrier, PA-C          We recommend signing up for the patient portal called MyChart.  Sign up information is provided on this After Visit Summary.  MyChart is used to connect with patients for Virtual Visits (Telemedicine).  Patients are able to view lab/test results, encounter notes, upcoming appointments, etc.  Non-urgent messages can be sent to your provider as well.   To learn more about what you can do with MyChart, go to [email or web address].   Other Instructions

## 2024-05-01 NOTE — Assessment & Plan Note (Signed)
 LDL cholesterol is well-controlled at 47 mg/dL. - Continue Lipitor 80 mg daily - Continue Zetia  10 mg daily

## 2024-10-05 ENCOUNTER — Ambulatory Visit: Admitting: Emergency Medicine

## 2025-03-10 ENCOUNTER — Ambulatory Visit
